# Patient Record
Sex: Female | Born: 1995 | Race: Black or African American | Hispanic: No | Marital: Single | Smoking: Never smoker
Health system: Southern US, Community
[De-identification: ages and names within clinical notes are randomized; demographics above are authoritative.]

## PROBLEM LIST (undated history)

## (undated) DIAGNOSIS — Z789 Other specified health status: Secondary | ICD-10-CM

## (undated) HISTORY — PX: NO PAST SURGERIES: SHX2092

---

## 2016-09-22 LAB — OB RESULTS CONSOLE HEPATITIS B SURFACE ANTIGEN: Hepatitis B Surface Ag: NEGATIVE

## 2016-09-22 LAB — OB RESULTS CONSOLE HIV ANTIBODY (ROUTINE TESTING): HIV: NONREACTIVE

## 2016-09-22 LAB — OB RESULTS CONSOLE RPR: RPR: NONREACTIVE

## 2017-02-05 LAB — OB RESULTS CONSOLE GC/CHLAMYDIA
CHLAMYDIA, DNA PROBE: NEGATIVE
Gonorrhea: NEGATIVE

## 2017-02-05 LAB — OB RESULTS CONSOLE GBS: STREP GROUP B AG: POSITIVE

## 2017-02-09 ENCOUNTER — Inpatient Hospital Stay
Admission: EM | Admit: 2017-02-09 | Discharge: 2017-02-12 | DRG: 806 | Disposition: A | Discharge status: Home / Self Care | Payer: Medicaid Other | Attending: Obstetrics and Gynecology | Admitting: Obstetrics and Gynecology

## 2017-02-09 ENCOUNTER — Other Ambulatory Visit: Payer: Self-pay

## 2017-02-09 DIAGNOSIS — O9081 Anemia of the puerperium: Secondary | ICD-10-CM | POA: Diagnosis not present

## 2017-02-09 DIAGNOSIS — O26893 Other specified pregnancy related conditions, third trimester: Secondary | ICD-10-CM | POA: Diagnosis present

## 2017-02-09 DIAGNOSIS — Z3A37 37 weeks gestation of pregnancy: Secondary | ICD-10-CM | POA: Diagnosis not present

## 2017-02-09 DIAGNOSIS — R109 Unspecified abdominal pain: Secondary | ICD-10-CM

## 2017-02-09 DIAGNOSIS — O99824 Streptococcus B carrier state complicating childbirth: Principal | ICD-10-CM | POA: Diagnosis present

## 2017-02-09 DIAGNOSIS — Z3483 Encounter for supervision of other normal pregnancy, third trimester: Secondary | ICD-10-CM | POA: Diagnosis present

## 2017-02-09 DIAGNOSIS — D62 Acute posthemorrhagic anemia: Secondary | ICD-10-CM | POA: Diagnosis not present

## 2017-02-09 HISTORY — DX: Other specified health status: Z78.9

## 2017-02-09 MED ORDER — FENTANYL CITRATE (PF) 100 MCG/2ML IJ SOLN
50.0000 ug | INTRAMUSCULAR | Status: DC | PRN
  Administered 2017-02-10 (×2): 50 ug via INTRAVENOUS
  Filled 2017-02-09: qty 2

## 2017-02-09 MED ORDER — PENICILLIN G POTASSIUM 5000000 UNITS IJ SOLR
2.5000 10*6.[IU] | INTRAVENOUS | Status: DC
  Filled 2017-02-09 (×3): qty 2.5

## 2017-02-09 MED ORDER — OXYTOCIN BOLUS FROM INFUSION: 500.0000 mL | Freq: Once | INTRAVENOUS | Status: DC

## 2017-02-09 MED ORDER — ONDANSETRON HCL 4 MG/2ML IJ SOLN
4.0000 mg | Freq: Four times a day (QID) | INTRAMUSCULAR | Status: DC | PRN
  Administered 2017-02-10: 4 mg via INTRAVENOUS
  Filled 2017-02-09: qty 2

## 2017-02-09 MED ORDER — LACTATED RINGERS IV SOLN
500.0000 mL | INTRAVENOUS | Status: DC | PRN
  Administered 2017-02-10: 500 mL via INTRAVENOUS

## 2017-02-09 MED ORDER — LIDOCAINE HCL (PF) 1 % IJ SOLN: 30.0000 mL | INTRAMUSCULAR | Status: DC | PRN

## 2017-02-09 MED ORDER — OXYTOCIN 40 UNITS IN LACTATED RINGERS INFUSION - SIMPLE MED
2.5000 [IU]/h | INTRAVENOUS | Status: DC
  Filled 2017-02-09: qty 1000

## 2017-02-09 MED ORDER — PENICILLIN G POTASSIUM 5000000 UNITS IJ SOLR
5.0000 10*6.[IU] | Freq: Once | INTRAVENOUS | Status: AC
  Administered 2017-02-10: 5 10*6.[IU] via INTRAVENOUS
  Filled 2017-02-09: qty 5

## 2017-02-09 MED ORDER — SOD CITRATE-CITRIC ACID 500-334 MG/5ML PO SOLN: 30.0000 mL | ORAL | Status: DC | PRN

## 2017-02-09 MED ORDER — LACTATED RINGERS IV SOLN
INTRAVENOUS | Status: DC
  Administered 2017-02-10 (×3): via INTRAVENOUS

## 2017-02-09 MED ORDER — ACETAMINOPHEN 325 MG PO TABS: 650.0000 mg | ORAL_TABLET | ORAL | Status: DC | PRN

## 2017-02-09 NOTE — H&P (Signed)
OB History & Physical   History of Present Illness:  Chief Complaint:   Worsening contractions with cervical change from 2cm to 4cm.  HPI:  Maureen Lopez is a 22 y.o. G3 p24 female at [redacted]w[redacted]d dated by US done at [redacted]w[redacted]d; Estimated Date of Delivery: 03/01/17.  LMP 04/26/16 unsure.    Active FM onset of ctx @ 1800 currently every 2-3 minutes; contractions worsening since arrival.  Denies LOF/VB. bloody show : none    Pregnancy Issues: 1. Late prenatal care at 19wks 2. Anemia, referred for iron infusions; taking iron TID 3. Resolved fetal unilateral urinary tract dilation.  4. Yeast UTI 12/2016- treated at [facility] L&D      Maternal Medical History:   Past Medical History:  Diagnosis Date  . Medical history non-contributory   anemia- referred for IV iron during pregnancy Hx uncomplicated SVD x 2, last 11/2016  Past Surgical History:  Procedure Laterality Date  . NO PAST SURGERIES      No Known Allergies  Prior to Admission medications   Medication Sig Start Date End Date Taking? Authorizing Provider  ferrous sulfate 325 (65 FE) MG EC tablet Take 325 mg by mouth 3 (three) times daily with meals.   Yes [provider]  Prenatal Vit-Fe Fumarate-FA (PRENATAL MULTIVITAMIN) TABS tablet Take 1 tablet by mouth daily at 12 noon.   Yes [provider]     Prenatal care site: [facility]   Social History: She  reports that  has never smoked. she has never used smokeless tobacco. She reports that she does not drink alcohol or use drugs.  Family History: family history is not on file.   Review of Systems: A full review of systems was performed and negative except as noted in the HPI.     Physical Exam:  Vital Signs: BP 125/61 (BP Location: Left Arm)   Pulse (!) 101   Temp 98.4 F (36.9 C) (Oral)   Resp 18   Ht 5\' 3"  (1.6 m)   Wt 153 lb (69.4 kg)   BMI 27.10 kg/m  General: no acute distress.  HEENT: normocephalic, atraumatic Heart:  regular rate & rhythm.  No murmurs/rubs/gallops Lungs: clear to auscultation bilaterally, normal respiratory effort Abdomen: soft, gravid, non-tender;  EFW: 7lbs Pelvic:   External: Normal external female genitalia  Cervix: Dilation: 4 / Effacement (%): 50 / Station: -2    Extremities: non-tender, symmetric, no edema bilaterally.  DTRs: 2+ Neurologic: Alert & oriented x 3.    No results found for this or any previous visit (from the past 24 hour(s)).  Pertinent Results:  Prenatal Labs: Blood type/Rh O Pos  Antibody screen neg  Rubella  Immune  Varicella  Immune  RPR NR  HBsAg Neg  HIV NR  GC neg  Chlamydia neg  Genetic screening negative  1 hour GTT  91  3 hour GTT   GBS  POS   FHT: 135bpm, mod, + accels, no decels TOCO: q2-58min SVE:  Dilation: 4 / Effacement (%): 50 / Station: -2    Cephalic by leopolds  No results found.  Assessment:  Maureen Lopez is a 22 y.o. G1P0 female at [redacted]w[redacted]d with GBS Pos and labor.   Plan:  1. Admit to Labor & Delivery; consents reviewed and obtained  2. Fetal Well being  - Fetal Tracing: Cat I tracing - Group B Streptococcus ppx indicated: Pos - Presentation: Cephalic confirmed by exam   3. Routine OB: - Prenatal labs reviewed,  as above - Rh O Pos - CBC, T&S, RPR on admit - Clear fluids, IVF  4. Monitoring of Labor -  Contractions external toco in place -  Pelvis proven to 7#3 -  Plan for continuous fetal monitoring -  Consider augmentation with Pitocin or AROM if labor progress slows. -  Maternal pain control as desired; will want epidural for pain mgmt - Anticipate vaginal delivery  5. Post Partum Planning: - Infant feeding: plans to pump breast and feed via bottle.  - Contraception: to be determined  Anthoni Geerts A, CNM 02/09/17 11:55 PM

## 2017-02-09 NOTE — OB Triage Note (Signed)
Patient c/o of contractions that started around 1800. Patient unsure of how far apart contractions are. Denies vaginal bleeding and LOF at this time. Reports positive FM. States this is a healthy pregnancy with no complications. Monitors placed and now assessing

## 2017-02-09 NOTE — Progress Notes (Signed)
Chief Complaint: Pt presented with C/o painful UCs x 3.5hrs. Denies VB or LOF. Endorses good FM. Reports uncomplicated pregnancy  Location: lower abd pain Onset/timing:  Duration: Quality:  Severity: Aggravating or alleviating conditions: Associated signs/symptoms: Context:   Maureen Lopez is a 22 y.o. female. She is at 2731w1d gestation. No LMP recorded. Patient is pregnant. Estimated Date of Delivery: 03/01/17  by US at 3442w1d; LMP 04/26/17 (unsure date). G3 P2002   Prenatal care site:  [facility]   Current pregnancy complicated by:  1. Late prenatal care at 19wks 2. Anemia, referred for iron infusions 3. Resolved fetal unilateral urinary tract dilation.  4. Yeast UTI 12/2016- [facility] L[facility]   S: Resting comfortably. no CTX, no VB.no LOF,  Active fetal movement. Denies: HA, visual changes, SOB, or RUQ/epigastric pain  Maternal Medical History:   Past Medical History:  Diagnosis Date  . Medical history non-contributory   Ob hx: term SVD x 2  Past Surgical History:  Procedure Laterality Date  . NO PAST SURGERIES      No Known Allergies  Prior to Admission medications   Medication Sig Start Date End Date Taking? Authorizing Provider  ferrous sulfate 325 (65 FE) MG EC tablet Take 325 mg by mouth 3 (three) times daily with meals.   Yes [provider]  Prenatal Vit-Fe Fumarate-FA (PRENATAL MULTIVITAMIN) TABS tablet Take 1 tablet by mouth daily at 12 noon.   Yes [provider]      Social History: She  reports that  has never smoked. she has never used smokeless tobacco. She reports that she does not drink alcohol or use drugs.  Family History: family history is not on file.   Review of Systems: A full review of systems was performed and negative except as noted in the HPI.     O:  There were no vitals taken for this visit. No results found for this or any previous visit (from the past 48 hour(s)).   Constitutional: NAD, AAOx3  HE/ENT:  extraocular movements grossly intact, moist mucous membranes CV: RRR PULM: nl respiratory effort, CTABL     Abd: gravid, non-tender, non-distended, soft      Ext: Non-tender, Nonedematous   Psych: mood appropriate, speech normal Pelvic: SVE per RN 2-3/50/-2  Fetal  monitoring: Cat 1 Appropriate for GA Baseline: 135 Variability: moderate Accelerations: present x >2 Decelerations absent     A/P: 22 y.o. 3531w1d here for antenatal surveillance for rule out labor   Fetal Wellbeing: Reassuring Cat 1 tracing.  Reactive NST   Plan to recheck SVE in 2hrs,    McVey, REBECCA A, CNM 02/09/17

## 2017-02-10 ENCOUNTER — Inpatient Hospital Stay: Payer: Medicaid Other | Admitting: Certified Registered Nurse Anesthetist

## 2017-02-10 LAB — TYPE AND SCREEN
ABO/RH(D): O POS
ANTIBODY SCREEN: NEGATIVE

## 2017-02-10 LAB — CBC
HCT: 31.9 % — ABNORMAL LOW (ref 35.0–47.0)
HEMOGLOBIN: 10.1 g/dL — AB (ref 12.0–16.0)
MCH: 22.4 pg — ABNORMAL LOW (ref 26.0–34.0)
MCHC: 31.7 g/dL — AB (ref 32.0–36.0)
MCV: 70.8 fL — ABNORMAL LOW (ref 80.0–100.0)
PLATELETS: 210 10*3/uL (ref 150–440)
RBC: 4.51 MIL/uL (ref 3.80–5.20)
RDW: 16.3 % — ABNORMAL HIGH (ref 11.5–14.5)
WBC: 13.5 10*3/uL — ABNORMAL HIGH (ref 3.6–11.0)

## 2017-02-10 MED ORDER — TETANUS-DIPHTH-ACELL PERTUSSIS 5-2.5-18.5 LF-MCG/0.5 IM SUSP: 0.5000 mL | Freq: Once | INTRAMUSCULAR | Status: DC

## 2017-02-10 MED ORDER — BENZOCAINE-MENTHOL 20-0.5 % EX AERO: 1.0000 "application " | INHALATION_SPRAY | CUTANEOUS | Status: DC | PRN

## 2017-02-10 MED ORDER — ACETAMINOPHEN 325 MG PO TABS
650.0000 mg | ORAL_TABLET | ORAL | Status: DC | PRN
  Administered 2017-02-10 – 2017-02-11 (×2): 650 mg via ORAL
  Filled 2017-02-10 (×2): qty 2

## 2017-02-10 MED ORDER — PRENATAL MULTIVITAMIN CH
1.0000 | ORAL_TABLET | Freq: Every day | ORAL | Status: DC
  Administered 2017-02-11 – 2017-02-12 (×2): 1 via ORAL
  Filled 2017-02-10 (×2): qty 1

## 2017-02-10 MED ORDER — BISACODYL 10 MG RE SUPP
10.0000 mg | Freq: Every day | RECTAL | Status: DC | PRN
  Filled 2017-02-10: qty 1

## 2017-02-10 MED ORDER — MISOPROSTOL 200 MCG PO TABS
ORAL_TABLET | ORAL | Status: AC
  Filled 2017-02-10: qty 4

## 2017-02-10 MED ORDER — IBUPROFEN 600 MG PO TABS
600.0000 mg | ORAL_TABLET | Freq: Four times a day (QID) | ORAL | Status: DC
  Administered 2017-02-10: 600 mg via ORAL
  Filled 2017-02-10 (×2): qty 1

## 2017-02-10 MED ORDER — WITCH HAZEL-GLYCERIN EX PADS: 1.0000 "application " | MEDICATED_PAD | CUTANEOUS | Status: DC | PRN

## 2017-02-10 MED ORDER — LIDOCAINE HCL (PF) 1 % IJ SOLN
INTRAMUSCULAR | Status: AC
  Filled 2017-02-10: qty 30

## 2017-02-10 MED ORDER — FENTANYL 2.5 MCG/ML W/ROPIVACAINE 0.15% IN NS 100 ML EPIDURAL (ARMC)
EPIDURAL | Status: AC
  Filled 2017-02-10: qty 100

## 2017-02-10 MED ORDER — FENTANYL 2.5 MCG/ML W/ROPIVACAINE 0.15% IN NS 100 ML EPIDURAL (ARMC)
EPIDURAL | Status: DC | PRN
  Administered 2017-02-10: 12 mL/h via EPIDURAL

## 2017-02-10 MED ORDER — DIPHENHYDRAMINE HCL 25 MG PO CAPS: 25.0000 mg | ORAL_CAPSULE | Freq: Four times a day (QID) | ORAL | Status: DC | PRN

## 2017-02-10 MED ORDER — SENNOSIDES-DOCUSATE SODIUM 8.6-50 MG PO TABS
2.0000 | ORAL_TABLET | ORAL | Status: DC
  Filled 2017-02-10: qty 2

## 2017-02-10 MED ORDER — ZOLPIDEM TARTRATE 5 MG PO TABS: 5.0000 mg | ORAL_TABLET | Freq: Every evening | ORAL | Status: DC | PRN

## 2017-02-10 MED ORDER — TERBUTALINE SULFATE 1 MG/ML IJ SOLN: 0.2500 mg | Freq: Once | INTRAMUSCULAR | Status: DC | PRN

## 2017-02-10 MED ORDER — SODIUM CHLORIDE 0.9% FLUSH: 3.0000 mL | INTRAVENOUS | Status: DC | PRN

## 2017-02-10 MED ORDER — MEASLES, MUMPS & RUBELLA VAC ~~LOC~~ INJ
0.5000 mL | INJECTION | Freq: Once | SUBCUTANEOUS | Status: DC
  Filled 2017-02-10: qty 0.5

## 2017-02-10 MED ORDER — IBUPROFEN 600 MG PO TABS
600.0000 mg | ORAL_TABLET | Freq: Four times a day (QID) | ORAL | Status: DC
  Administered 2017-02-10 – 2017-02-12 (×6): 600 mg via ORAL
  Filled 2017-02-10 (×7): qty 1

## 2017-02-10 MED ORDER — LIDOCAINE-EPINEPHRINE (PF) 1.5 %-1:200000 IJ SOLN
INTRAMUSCULAR | Status: DC | PRN
  Administered 2017-02-10: 3 mL via PERINEURAL

## 2017-02-10 MED ORDER — SODIUM CHLORIDE 0.9 % IV SOLN: 250.0000 mL | INTRAVENOUS | Status: DC | PRN

## 2017-02-10 MED ORDER — SIMETHICONE 80 MG PO CHEW: 80.0000 mg | CHEWABLE_TABLET | ORAL | Status: DC | PRN

## 2017-02-10 MED ORDER — SODIUM CHLORIDE 0.9% FLUSH: 3.0000 mL | Freq: Two times a day (BID) | INTRAVENOUS | Status: DC

## 2017-02-10 MED ORDER — FLEET ENEMA 7-19 GM/118ML RE ENEM: 1.0000 | ENEMA | Freq: Every day | RECTAL | Status: DC | PRN

## 2017-02-10 MED ORDER — LIDOCAINE HCL (PF) 1 % IJ SOLN
INTRAMUSCULAR | Status: DC | PRN
  Administered 2017-02-10: 3 mL

## 2017-02-10 MED ORDER — PENICILLIN G POT IN DEXTROSE 60000 UNIT/ML IV SOLN
3.0000 10*6.[IU] | INTRAVENOUS | Status: DC
  Administered 2017-02-10: 3 10*6.[IU] via INTRAVENOUS
  Filled 2017-02-10 (×6): qty 50

## 2017-02-10 MED ORDER — OXYTOCIN 40 UNITS IN LACTATED RINGERS INFUSION - SIMPLE MED: 1.0000 m[IU]/min | INTRAVENOUS | Status: DC

## 2017-02-10 MED ORDER — ONDANSETRON HCL 4 MG/2ML IJ SOLN: 4.0000 mg | INTRAMUSCULAR | Status: DC | PRN

## 2017-02-10 MED ORDER — OXYTOCIN 40 UNITS IN LACTATED RINGERS INFUSION - SIMPLE MED
1.0000 m[IU]/min | INTRAVENOUS | Status: DC
  Administered 2017-02-10: 2 m[IU]/min via INTRAVENOUS

## 2017-02-10 MED ORDER — BUPIVACAINE HCL (PF) 0.25 % IJ SOLN
INTRAMUSCULAR | Status: DC | PRN
  Administered 2017-02-10: 4 mL via EPIDURAL
  Administered 2017-02-10: 5 mL via EPIDURAL

## 2017-02-10 MED ORDER — OXYCODONE HCL 5 MG PO TABS: 5.0000 mg | ORAL_TABLET | ORAL | Status: DC | PRN

## 2017-02-10 MED ORDER — COCONUT OIL OIL: 1.0000 "application " | TOPICAL_OIL | Status: DC | PRN

## 2017-02-10 MED ORDER — ONDANSETRON HCL 4 MG PO TABS: 4.0000 mg | ORAL_TABLET | ORAL | Status: DC | PRN

## 2017-02-10 MED ORDER — DIBUCAINE 1 % RE OINT: 1.0000 "application " | TOPICAL_OINTMENT | RECTAL | Status: DC | PRN

## 2017-02-10 MED ORDER — AMMONIA AROMATIC IN INHA
RESPIRATORY_TRACT | Status: AC
  Filled 2017-02-10: qty 10

## 2017-02-10 MED ORDER — OXYTOCIN 10 UNIT/ML IJ SOLN
INTRAMUSCULAR | Status: AC
  Filled 2017-02-10: qty 2

## 2017-02-10 NOTE — Discharge Summary (Signed)
Obstetrical Discharge Summary  Patient Name: Maureen Lopez DOB: 1995/03/12 MRN: [id number]  Date of Admission: 02/09/2017 Date of Delivery: 02/10/17 Delivered by: C.Jones, CNM Date of Discharge: 02/12/17 Primary ON:[facility]EXBOB:[facility]  LMP:EDC Estimated Date of Delivery: 03/01/17 Gestational Age at Delivery: 356w2d   Antepartum complications: MW:[id number]:indicated abnormal fetal mild lt urinary tract dilation noted on 19 week scan, increased risk for Down's and increased risk 1:48 in Quad screen, Late PNC, Tobacco usage and quit with pregnancy. Anemia with PICA and Fe replacement.  Admitting Diagnosis: Term pregnancy in early labor  Secondary Diagnosis: Patient Active Problem List   Diagnosis Date Noted  . Abdominal pain in pregnancy, third trimester 02/09/2017  . Indication for care in labor or delivery 02/09/2017    Augmentation: AROM, Pitocin  Complications: Fetal decels to 60's requiring re-positioning, IFSE applied  Intrapartum complications/course: None Date of Delivery: 02/10/17 Delivered By: Milon Scorearon Jones, CNM Delivery Type: NSVD of viable female with small linear tear Rt periurethral non-repaired Anesthesia: epidural Placenta: sponatneous Laceration: Small linear tear Rt periurethral non-repaired Episiotomy: none Newborn Data:  Live born female  Birth Weight:  3710/6#0oz APGAR: , 688,9  Newborn Delivery   Birth date/time:  02/10/2017 10:41:00 Delivery type:  Vaginal, Spontaneous       Postpartum Procedures: None   Post partum course: Stable  Patient had an uncomplicated postpartum course.  By time of discharge on PPD#2, her pain was controlled on oral pain medications; she had appropriate lochia and was ambulating, voiding without difficulty and tolerating regular diet.  She was deemed stable for discharge to home.     Discharge Physical Exam:  BP 122/62 (BP Location: Right Arm)   Pulse 69   Temp 98.6 F (37 C) (Oral)   Resp 18   Ht 5\' 3"  (1.6 m)   Wt 69.4 kg (153 lb)   SpO2 99%    Breastfeeding? Unknown   BMI 27.10 kg/m   General: NAD CV: RRR Pulm: CTABL, nl effort ABD: s/nd/nt, fundus firm and below the umbilicus Lochia: moderate DVT Evaluation: LE non-ttp, no evidence of DVT on exam.  Hemoglobin  Date Value Ref Range Status  02/11/2017 9.3 (L) 12.0 - 16.0 g/dL Final   HCT  Date Value Ref Range Status  02/11/2017 29.4 (L) 35.0 - 47.0 % Final     Disposition: stable, discharge to home. Baby Feeding: breastmilk & formula Baby Disposition: home with mom  Rh Immune globulin given: N/A Rubella vaccine given: Pt is immune  Tdap vaccine given in AP or PP setting: 03/15/07 Flu vaccine given in AP or PP setting:   Contraception Plans: Mirena IUD  Prenatal Labs:  Recent Results (from the past 2160 hour(s))  OB RESULT CONSOLE Group B Strep     Status: None   Collection Time: 02/05/17 12:00 AM  Result Value Ref Range   GBS Positive   OB RESULTS CONSOLE GC/Chlamydia     Status: None   Collection Time: 02/05/17 12:00 AM  Result Value Ref Range   Gonorrhea Negative    Chlamydia Negative   CBC     Status: Abnormal   Collection Time: 02/09/17 11:43 PM  Result Value Ref Range   WBC 13.5 (H) 3.6 - 11.0 K/uL   RBC 4.51 3.80 - 5.20 MIL/uL   Hemoglobin 10.1 (L) 12.0 - 16.0 g/dL   HCT 27.231.9 (L) 53.635.0 - 64.447.0 %   MCV 70.8 (L) 80.0 - 100.0 fL   MCH 22.4 (L) 26.0 - 34.0 pg   MCHC 31.7 (L) 32.0 -  36.0 g/dL   RDW 40.9 (H) 81.1 - 91.4 %   Platelets 210 150 - 440 K/uL    Comment: Performed at [facility], [address] [place], [place] [address]  Type and screen [facility]     Status: None   Collection Time: 02/09/17 11:43 PM  Result Value Ref Range   ABO/RH(D) O POS    Antibody Screen NEG    Sample Expiration      02/12/2017 Performed at [facility], [address] [place], [place] [address]   RPR     Status: None   Collection Time: 02/09/17 11:43 PM  Result Value Ref Range   RPR Ser Ql Non Reactive Non  Reactive    Comment: (NOTE) Performed At: [facility] [address] [place], [place] [id number] Jolene Schimke MD [phone] Performed at [facility], [address] [place], [place] [address]   CBC     Status: Abnormal   Collection Time: 02/11/17  5:59 AM  Result Value Ref Range   WBC 11.1 (H) 3.6 - 11.0 K/uL   RBC 4.10 3.80 - 5.20 MIL/uL   Hemoglobin 9.3 (L) 12.0 - 16.0 g/dL   HCT 53.6 (L) 64.4 - 03.4 %   MCV 71.7 (L) 80.0 - 100.0 fL   MCH 22.7 (L) 26.0 - 34.0 pg   MCHC 31.7 (L) 32.0 - 36.0 g/dL   RDW 74.2 (H) 59.5 - 63.8 %   Platelets 168 150 - 440 K/uL    Comment: Performed at [facility], [address] [place], [place] [address]  RPR: NR HBSAG:neg HIV:Neg Drug screen:Negative  Plan:  FRANNIE SHEDRICK was discharged to home in good condition. Follow-up appointment with delivering provider in 4-6 weeks.  Discharge Medications: Allergies as of 02/12/2017   No Known Allergies     Medication List    STOP taking these medications   ferrous sulfate 325 (65 FE) MG EC tablet     TAKE these medications   ibuprofen 600 MG tablet Commonly known as:  ADVIL,MOTRIN Take 1 tablet (600 mg total) by mouth every 6 (six) hours.   prenatal multivitamin Tabs tablet Take 1 tablet by mouth daily at 12 noon.       Follow-up Information    Sharee Pimple, CNM. Schedule an appointment as soon as possible for a visit in 6 week(s).   Specialty:  Obstetrics and Gynecology Why:  Please make an appointment in 4-6 weeks for a routine postpartum visit.  Contact information: [address] [facility] [place] [address] [phone]        Genia Del, CNM Follow up in 6 week(s).   Specialty:  Certified Nurse Midwife Why:  call at 4 weeks to request Implanon insertion at 6 week PP exam  Contact information: [address] [place] [address] [phone]           Signed: Jennell Corner MD Hit refresh and delete this line

## 2017-02-10 NOTE — Discharge Instructions (Signed)
Vaginal Delivery, Care After °Refer to this sheet in the next few weeks. These instructions provide you with information about caring for yourself after vaginal delivery. Your health care provider may also give you more specific instructions. Your treatment has been planned according to current medical practices, but problems sometimes occur. Call your health care provider if you have any problems or questions. °What can I expect after the procedure? °After vaginal delivery, it is common to have: °· Some bleeding from your vagina. °· Soreness in your abdomen, your vagina, and the area of skin between your vaginal opening and your anus (perineum). °· Pelvic cramps. °· Fatigue. ° °Follow these instructions at home: °Medicines °· Take over-the-counter and prescription medicines only as told by your health care provider. °· If you were prescribed an antibiotic medicine, take it as told by your health care provider. Do not stop taking the antibiotic until it is finished. °Driving ° °· Do not drive or operate heavy machinery while taking prescription pain medicine. °· Do not drive for 24 hours if you received a sedative. °Lifestyle °· Do not drink alcohol. This is especially important if you are breastfeeding or taking medicine to relieve pain. °· Do not use tobacco products, including cigarettes, chewing tobacco, or e-cigarettes. If you need help quitting, ask your health care provider. °Eating and drinking °· Drink at least 8 eight-ounce glasses of water every day unless you are told not to by your health care provider. If you choose to breastfeed your baby, you may need to drink more water than this. °· Eat high-fiber foods every day. These foods may help prevent or relieve constipation. High-fiber foods include: °? Whole grain cereals and breads. °? Brown rice. °? Beans. °? Fresh fruits and vegetables. °Activity °· Return to your normal activities as told by your health care provider. Ask your health care provider  what activities are safe for you. °· Rest as much as possible. Try to rest or take a nap when your baby is sleeping. °· Do not lift anything that is heavier than your baby or 10 lb (4.5 kg) until your health care provider says that it is safe. °· Talk with your health care provider about when you can engage in sexual activity. This may depend on your: °? Risk of infection. °? Rate of healing. °? Comfort and desire to engage in sexual activity. °Vaginal Care °· If you have an episiotomy or a vaginal tear, check the area every day for signs of infection. Check for: °? More redness, swelling, or pain. °? More fluid or blood. °? Warmth. °? Pus or a bad smell. °· Do not use tampons or douches until your health care provider says this is safe. °· Watch for any blood clots that may pass from your vagina. These may look like clumps of dark red, brown, or black discharge. °General instructions °· Keep your perineum clean and dry as told by your health care provider. °· Wear loose, comfortable clothing. °· Wipe from front to back when you use the toilet. °· Ask your health care provider if you can shower or take a bath. If you had an episiotomy or a perineal tear during labor and delivery, your health care provider may tell you not to take baths for a certain length of time. °· Wear a bra that supports your breasts and fits you well. °· If possible, have someone help you with household activities and help care for your baby for at least a few days after   you leave the hospital. °· Keep all follow-up visits for you and your baby as told by your health care provider. This is important. °Contact a health care provider if: °· You have: °? Vaginal discharge that has a bad smell. °? Difficulty urinating. °? Pain when urinating. °? A sudden increase or decrease in the frequency of your bowel movements. °? More redness, swelling, or pain around your episiotomy or vaginal tear. °? More fluid or blood coming from your episiotomy or  vaginal tear. °? Pus or a bad smell coming from your episiotomy or vaginal tear. °? A fever. °? A rash. °? Little or no interest in activities you used to enjoy. °? Questions about caring for yourself or your baby. °· Your episiotomy or vaginal tear feels warm to the touch. °· Your episiotomy or vaginal tear is separating or does not appear to be healing. °· Your breasts are painful, hard, or turn red. °· You feel unusually sad or worried. °· You feel nauseous or you vomit. °· You pass large blood clots from your vagina. If you pass a blood clot from your vagina, save it to show to your health care provider. Do not flush blood clots down the toilet without having your health care provider look at them. °· You urinate more than usual. °· You are dizzy or light-headed. °· You have not breastfed at all and you have not had a menstrual period for 12 weeks after delivery. °· You have stopped breastfeeding and you have not had a menstrual period for 12 weeks after you stopped breastfeeding. °Get help right away if: °· You have: °? Pain that does not go away or does not get better with medicine. °? Chest pain. °? Difficulty breathing. °? Blurred vision or spots in your vision. °? Thoughts about hurting yourself or your baby. °· You develop pain in your abdomen or in one of your legs. °· You develop a severe headache. °· You faint. °· You bleed from your vagina so much that you fill two sanitary pads in one hour. °This information is not intended to replace advice given to you by your health care provider. Make sure you discuss any questions you have with your health care provider. °Document Released: 01/16/2000 Document Revised: 07/02/2015 Document Reviewed: 02/02/2015 °Elsevier Interactive Patient Education © 2018 [employer] ° °

## 2017-02-10 NOTE — Progress Notes (Signed)
Patient ID: Maureen Lopez, female   DOB: 08-Jun-1995, 22 y.o.   MRN: [id number] VAGINAL DELIVERY NOTE:  Date of Delivery: 02/10/2017 Primary OB: [facility] Gestational Age/EDD: 2562w2d 03/01/2017, by Patient Reported Antepartum complications: none  Attending Physician: CJones, CNM Delivery Type:  NSVD of viable female infant  Anesthesia: Epidural  Laceration:none Episiotomy: None Placenta:SDOP intact Intrapartum complications: None  Estimated Blood Loss: 100 ml's GBS: Pos Procedure Details: CTSP as pt was complete and pushing uncontrollably, Placed in stirrups with HOB elevated. NSVD of vtx, Ant and post shoulder and body del at 1041 and infant to mom's abd, no CAN noted. Delayed cord clamping with CCx2 and cut per Dad. Cord blood collected. SDOP intact with no evidence of abruption or other problems. Perineum intact except for 1 linear tear very superficial of the Lt perineal area. VSS. EBL 395 ml's. Hemostasis achieved, Skin to skin with mom, No needles or sponges used. Pitocin per IV drip pp.   Baby: Liveborn Boy "Kayden", Apgars 8,9, weight 6 #, 0 oz, baby named "Maureen Lopez"

## 2017-02-10 NOTE — Progress Notes (Signed)
Maureen Lopez is a 22 y.o. G3P2 at 6477w2d. See H&P.   Subjective: "I am comfortable with UC's".  Objective: BP 120/68   Pulse 82   Temp 98.8 F (37.1 C) (Oral)   Resp 18   Ht 5\' 3"  (1.6 m)   Wt 69.4 kg (153 lb)   SpO2 100%   BMI 27.10 kg/m  I/O last 3 completed shifts: In: 867.4 [I.V.:867.4] Out: -  No intake/output data recorded.  FHT: 130, Cat 1 UC: q 3 mins,  SVE:   5/80%/vtx-1 Labs: Lab Results  Component Value Date   WBC 13.5 (H) 02/09/2017   HGB 10.1 (L) 02/09/2017   HCT 31.9 (L) 02/09/2017   MCV 70.8 (L) 02/09/2017   PLT 210 02/09/2017  AROM for clear fluid mod amt. IUPC inserted for improved monitoring and ability to run Pitocin more accurately. Pitocin on . Dr Bernestine AmassBeasely aware of pt status and agrees with plan of care.4 mun/min   Assessment / Plan:  Labor:Progressing Preeclampsia: None  Fetal Wellbeing: Reassuring  Pain Control:   Epidural  I/D:   None Anticipated MOD: SVD   Sharee Pimplearon W Jones 02/10/2017, 9:15 AM

## 2017-02-10 NOTE — Progress Notes (Signed)
Epidural catheter removed intact 

## 2017-02-10 NOTE — Anesthesia Preprocedure Evaluation (Signed)
Anesthesia Evaluation  Patient identified by MRN, date of birth, ID band Patient awake and Patient confused    Reviewed: Allergy & Precautions, NPO status , Patient's Chart, lab work & pertinent test results, Unable to perform ROS - Chart review only  History of Anesthesia Complications Negative for: history of anesthetic complications  Airway Mallampati: II  TM Distance: >3 FB     Dental  (+) Teeth Intact   Pulmonary neg pulmonary ROS,    Pulmonary exam normal breath sounds clear to auscultation       Cardiovascular Exercise Tolerance: Good negative cardio ROS Normal cardiovascular exam Rhythm:regular     Neuro/Psych negative neurological ROS  negative psych ROS   GI/Hepatic negative GI ROS, Neg liver ROS,   Endo/Other  negative endocrine ROS  Renal/GU negative Renal ROS  negative genitourinary   Musculoskeletal   Abdominal   Peds  Hematology negative hematology ROS (+)   Anesthesia Other Findings   Reproductive/Obstetrics (+) Pregnancy                             Anesthesia Physical Anesthesia Plan  ASA: II  Anesthesia Plan: Epidural   Post-op Pain Management:    Induction:   PONV Risk Score and Plan:   Airway Management Planned:   Additional Equipment:   Intra-op Plan:   Post-operative Plan:   Informed Consent: I have reviewed the patients History and Physical, chart, labs and discussed the procedure including the risks, benefits and alternatives for the proposed anesthesia with the patient or authorized representative who has indicated his/her understanding and acceptance.     Plan Discussed with: Anesthesiologist  Anesthesia Plan Comments:         Anesthesia Quick Evaluation

## 2017-02-10 NOTE — Progress Notes (Signed)
Labor Progress Note  Maureen Lopez is a 22 y.o. G3P2 at 2834w2d by ultrasound admitted for labor  Subjective: feeling UCs, sleeping between, s/p 1 dose of fentanyl.   Objective: BP 135/77 (BP Location: Left Arm)   Pulse 85   Temp 98.5 F (36.9 C) (Oral)   Resp 18   Ht 5\' 3"  (1.6 m)   Wt 153 lb (69.4 kg)   BMI 27.10 kg/m  Notable VS details: reviewed  Fetal Assessment: FHT:  FHR: 135 bpm, variability: moderate,  accelerations:  Abscent,  decelerations:  Present variables nonrecurrent Category/reactivity:  Category II UC:   irregular, every 1-4 minutes, Pitocin started about 0530 SVE:  Dilation: 4.5 Effacement (%): 70 Cervical Position: Middle Station: -1 Presentation: Vertex Exam by:: RS Membrane status: intact   Labs: Lab Results  Component Value Date   WBC 13.5 (H) 02/09/2017   HGB 10.1 (L) 02/09/2017   HCT 31.9 (L) 02/09/2017   MCV 70.8 (L) 02/09/2017   PLT 210 02/09/2017    Assessment / Plan: IUP at 37.2wks, + GBS, progressed spontaneously to 4-5cm. brief periods of Cat II tracing with intermittent variables.   Labor: Progressing on Pitocin, will continue to increase then AROM Preeclampsia:  no signs or symptoms of toxicity Fetal Wellbeing:  Category I now Pain Control:  IVPM or epidural on request, s/p IVPM x 1. I/D:  GBS Pos- s/p 2 doses of PCN Anticipated MOD:  NSVD  Onika Gudiel A, CNM 02/10/2017, 6:15 AM

## 2017-02-11 LAB — CBC
HCT: 29.4 % — ABNORMAL LOW (ref 35.0–47.0)
Hemoglobin: 9.3 g/dL — ABNORMAL LOW (ref 12.0–16.0)
MCH: 22.7 pg — ABNORMAL LOW (ref 26.0–34.0)
MCHC: 31.7 g/dL — ABNORMAL LOW (ref 32.0–36.0)
MCV: 71.7 fL — ABNORMAL LOW (ref 80.0–100.0)
PLATELETS: 168 10*3/uL (ref 150–440)
RBC: 4.1 MIL/uL (ref 3.80–5.20)
RDW: 16.2 % — AB (ref 11.5–14.5)
WBC: 11.1 10*3/uL — AB (ref 3.6–11.0)

## 2017-02-11 LAB — RPR: RPR: NONREACTIVE

## 2017-02-11 MED ORDER — FERROUS SULFATE 325 (65 FE) MG PO TABS
325.0000 mg | ORAL_TABLET | Freq: Every day | ORAL | Status: DC
  Administered 2017-02-11 – 2017-02-12 (×2): 325 mg via ORAL
  Filled 2017-02-11 (×2): qty 1

## 2017-02-11 NOTE — Anesthesia Postprocedure Evaluation (Signed)
Anesthesia Post Note  Patient: Maureen Lopez  Procedure(s) Performed: AN AD HOC LABOR EPIDURAL  Patient location during evaluation: Mother Baby Anesthesia Type: Epidural Level of consciousness: awake, awake and alert and oriented Pain management: pain level controlled Vital Signs Assessment: post-procedure vital signs reviewed and stable Respiratory status: spontaneous breathing Cardiovascular status: blood pressure returned to baseline Postop Assessment: no headache, no backache and no apparent nausea or vomiting Anesthetic complications: no     Last Vitals:  Vitals:   02/10/17 2343 02/11/17 0408  BP: 127/70 (!) 103/55  Pulse: 91 68  Resp: 16 16  Temp: 36.7 C 36.7 C  SpO2: 98% 100%    Last Pain:  Vitals:   02/11/17 0408  TempSrc: Oral  PainSc:                  Karoline Caldwelleana Loralei Radcliffe

## 2017-02-11 NOTE — Progress Notes (Signed)
Post Partum Day 1  Subjective: Doing well, no concerns. Ambulating without difficulty, pain managed with PO meds, tolerating regular diet, and voiding without difficulty.   No fever/chills, chest pain, shortness of breath, nausea/vomiting, or leg pain. No nipple or breast pain.  Objective: BP 136/71 (BP Location: Left Arm)   Pulse 70   Temp 98.7 F (37.1 C) (Oral)   Resp 18   Ht 5\' 3"  (1.6 m)   Wt 69.4 kg (153 lb)   SpO2 99%   Breastfeeding? Unknown   BMI 27.10 kg/m    Physical Exam:  General: alert, cooperative, appears stated age and no distress Breasts: soft/nontender CV: RRR Pulm: nl effort, CTABL Abdomen: soft, non-tender, active bowel sounds Uterine Fundus: firm Lochia: appropriate DVT Evaluation: No evidence of DVT seen on physical exam. No cords or calf tenderness. No significant calf/ankle edema.  Recent Labs    02/09/17 2343 02/11/17 0559  HGB 10.1* 9.3*  HCT 31.9* 29.4*  WBC 13.5* 11.1*  PLT 210 168    Assessment/Plan: 21 y.o. G3P3003 postpartum day # 1  -Continue routine PP care -Lactation consult, discussed normal colostrum and milk production.  -Acute blood loss anemia - hemodynamically stable and asymptomatic; start po ferrous sulfate daily with stool softeners.  Disposition: Might desire d/c home today. Continue inpatient postpartum care.    LOS: 2 days   Genia DelMargaret Trelyn Vanderlinde, CNM 02/11/2017, 10:34 AM   ----- Genia DelMargaret Constanza Mincy Certified Nurse Midwife [facility]

## 2017-02-12 MED ORDER — IBUPROFEN 600 MG PO TABS: 600.0000 mg | ORAL_TABLET | Freq: Four times a day (QID) | ORAL | 0 refills | Status: DC

## 2017-02-12 NOTE — Progress Notes (Signed)
Patient discharged home with infant. Discharge instructions, prescriptions and follow up appointment given to and reviewed with patient. Patient verbalized understanding. Patient wheeled out with infant by RN 

## 2017-06-16 ENCOUNTER — Other Ambulatory Visit: Payer: Self-pay

## 2017-06-16 ENCOUNTER — Emergency Department
Admission: EM | Admit: 2017-06-16 | Discharge: 2017-06-16 | Disposition: A | Discharge status: Home / Self Care | Payer: Medicaid Other | Attending: Emergency Medicine | Admitting: Emergency Medicine

## 2017-06-16 ENCOUNTER — Encounter: Payer: Self-pay | Admitting: Emergency Medicine

## 2017-06-16 DIAGNOSIS — J069 Acute upper respiratory infection, unspecified: Secondary | ICD-10-CM | POA: Insufficient documentation

## 2017-06-16 DIAGNOSIS — B9789 Other viral agents as the cause of diseases classified elsewhere: Secondary | ICD-10-CM

## 2017-06-16 DIAGNOSIS — R509 Fever, unspecified: Secondary | ICD-10-CM | POA: Diagnosis not present

## 2017-06-16 DIAGNOSIS — R05 Cough: Secondary | ICD-10-CM | POA: Diagnosis present

## 2017-06-16 MED ORDER — GUAIFENESIN-CODEINE 100-10 MG/5ML PO SOLN: 10.0000 mL | Freq: Three times a day (TID) | ORAL | 0 refills | Status: DC | PRN

## 2017-06-16 NOTE — ED Notes (Signed)
See triage note   Presents with cough and sinus congestion   Sx's started about 3 days ago  Low grade fever on arrival   States cough is non prod

## 2017-06-16 NOTE — ED Triage Notes (Signed)
C/O chills, sinus congestion, cough x 3 days.

## 2017-06-16 NOTE — ED Provider Notes (Signed)
[facility] Emergency Department Provider Note  ____________________________________________  Time seen: Approximately 2:28 PM  I have reviewed the triage vital signs and the nursing notes.   HISTORY  Chief Complaint Cough and Chills   HPI KAYSI OURADA is a 22 y.o. female who presents to the emergency department for evaluation and treatment of cough and sinus congestion with chills and low grade fever. Symptoms started about 3 days ago.  No alleviating measures have been attempted for this complaint.   Past Medical History:  Diagnosis Date  . Medical history non-contributory     Patient Active Problem List   Diagnosis Date Noted  . Abdominal pain in pregnancy, third trimester 02/09/2017  . Indication for care in labor or delivery 02/09/2017    Past Surgical History:  Procedure Laterality Date  . NO PAST SURGERIES      Prior to Admission medications   Medication Sig Start Date End Date Taking? Authorizing Provider  ibuprofen (ADVIL,MOTRIN) 600 MG tablet Take 1 tablet (600 mg total) by mouth every 6 (six) hours. 02/12/17   Schermerhorn, Ihor Austin, MD  Prenatal Vit-Fe Fumarate-FA (PRENATAL MULTIVITAMIN) TABS tablet Take 1 tablet by mouth daily at 12 noon.    [provider]    Allergies Patient has no known allergies.  No family history on file.  Social History Social History   Tobacco Use  . Smoking status: Never Smoker  . Smokeless tobacco: Never Used  Substance Use Topics  . Alcohol use: No    Frequency: Never  . Drug use: No    Review of Systems Constitutional: Positive for fever/chills ENT: Negative for sore throat. Cardiovascular: Denies chest pain. Respiratory: Negative for shortness of breath.  Positive for cough. Gastrointestinal: Negative for nausea, no vomiting.  No diarrhea.  Musculoskeletal: Positive for body aches Skin: Negative for rash. Neurological: Negative for  headaches ____________________________________________   PHYSICAL EXAM:  VITAL SIGNS: ED Triage Vitals  Enc Vitals Group     BP 06/16/17 1353 (!) 147/80     Pulse Rate 06/16/17 1353 (!) 105     Resp 06/16/17 1353 16     Temp 06/16/17 1353 99.9 F (37.7 C)     Temp Source 06/16/17 1353 Oral     SpO2 06/16/17 1353 96 %     Weight 06/16/17 1350 135 lb (61.2 kg)     Height 06/16/17 1350  (1.6 m)     Head Circumference --      Peak Flow --      Pain Score 06/16/17 1350 6     Pain Loc --      Pain Edu? --      Excl. in GC? --     Constitutional: Alert and oriented.  Acutely ill appearing and in no acute distress. Eyes: Conjunctivae are normal. EOMI. Ears: Bilateral tympanic membranes are retracted and mildly erythematous Nose: Sinus congestion noted; clear rhinnorhea. Mouth/Throat: Mucous membranes are moist.  Oropharynx normal. Tonsils not visualized. Neck: No stridor.  Lymphatic: No cervical lymphadenopathy. Cardiovascular: Normal rate, regular rhythm. Good peripheral circulation. Respiratory: Normal respiratory effort.  No retractions.  Breath sounds clear to auscultation throughout. Gastrointestinal: Soft and nontender.  Musculoskeletal: FROM x 4 extremities.  Neurologic:  Normal speech and language.  Skin:  Skin is warm, dry and intact. No rash noted. Psychiatric: Mood and affect are normal. Speech and behavior are normal.  ____________________________________________   LABS (all labs ordered are listed, but only abnormal results are displayed)  Labs Reviewed -  No data to display ____________________________________________  EKG  Not indicated ____________________________________________  RADIOLOGY  Not indicated ____________________________________________   PROCEDURES  Procedure(s) performed: None  Critical Care performed: No ____________________________________________   INITIAL IMPRESSION / ASSESSMENT AND PLAN / ED COURSE  22 y.o. female  who presents to the emergency department for treatment and evaluation of symptoms and exam most consistent with a viral upper respiratory infection.  She will be given a prescription for guaifenesin with codeine and encouraged to rest, increase fluids for the next few days.  She was encouraged to return to the ER for symptoms that change or worsen if unable to schedule an appointment.  Medications - No data to display  ED Discharge Orders    None       Pertinent labs & imaging results that were available during my care of the patient were reviewed by me and considered in my medical decision making (see chart for details).    If controlled substance prescribed during this visit, 12 month history viewed on the [place] prior to issuing an initial prescription for Schedule II or III opiod. ____________________________________________   FINAL CLINICAL IMPRESSION(S) / ED DIAGNOSES  Final diagnoses:  None    Note:  This document was prepared using Dragon voice recognition software and may include unintentional dictation errors.     Chinita Pester, FNP 06/16/17 1505    Jene Every, MD 06/16/17 [phone]

## 2017-07-18 ENCOUNTER — Emergency Department
Admission: EM | Admit: 2017-07-18 | Discharge: 2017-07-18 | Disposition: A | Discharge status: Home / Self Care | Payer: No Typology Code available for payment source | Attending: Emergency Medicine | Admitting: Emergency Medicine

## 2017-07-18 ENCOUNTER — Emergency Department: Payer: No Typology Code available for payment source

## 2017-07-18 ENCOUNTER — Encounter: Payer: Self-pay | Admitting: Intensive Care

## 2017-07-18 ENCOUNTER — Other Ambulatory Visit: Payer: Self-pay

## 2017-07-18 DIAGNOSIS — Y929 Unspecified place or not applicable: Secondary | ICD-10-CM | POA: Insufficient documentation

## 2017-07-18 DIAGNOSIS — Y999 Unspecified external cause status: Secondary | ICD-10-CM | POA: Insufficient documentation

## 2017-07-18 DIAGNOSIS — Y939 Activity, unspecified: Secondary | ICD-10-CM | POA: Insufficient documentation

## 2017-07-18 DIAGNOSIS — S0990XA Unspecified injury of head, initial encounter: Secondary | ICD-10-CM | POA: Diagnosis not present

## 2017-07-18 DIAGNOSIS — Z79899 Other long term (current) drug therapy: Secondary | ICD-10-CM | POA: Insufficient documentation

## 2017-07-18 DIAGNOSIS — M79662 Pain in left lower leg: Secondary | ICD-10-CM | POA: Diagnosis not present

## 2017-07-18 MED ORDER — CYCLOBENZAPRINE HCL 10 MG PO TABS: 10.0000 mg | ORAL_TABLET | Freq: Three times a day (TID) | ORAL | 0 refills | Status: AC | PRN

## 2017-07-18 MED ORDER — MELOXICAM 15 MG PO TABS: 15.0000 mg | ORAL_TABLET | Freq: Every day | ORAL | 1 refills | Status: AC
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
## 2017-08-06 ENCOUNTER — Emergency Department
Admission: EM | Admit: 2017-08-06 | Discharge: 2017-08-06 | Disposition: A | Discharge status: Home / Self Care | Payer: Self-pay | Attending: Emergency Medicine | Admitting: Emergency Medicine

## 2017-08-06 ENCOUNTER — Encounter: Payer: Self-pay | Admitting: Emergency Medicine

## 2017-08-06 DIAGNOSIS — L03314 Cellulitis of groin: Secondary | ICD-10-CM | POA: Insufficient documentation

## 2017-08-06 MED ORDER — SULFAMETHOXAZOLE-TRIMETHOPRIM 800-160 MG PO TABS: 1.0000 | ORAL_TABLET | Freq: Two times a day (BID) | ORAL | 0 refills | Status: DC

## 2017-08-06 MED ORDER — SULFAMETHOXAZOLE-TRIMETHOPRIM 800-160 MG PO TABS
1.0000 | ORAL_TABLET | Freq: Once | ORAL | Status: AC
  Administered 2017-08-06: 1 via ORAL
  Filled 2017-08-06: qty 1

## 2017-08-06 NOTE — ED Provider Notes (Signed)
[facility] Emergency Department Provider Note  ____________________________________________  Time seen: Approximately 9:58 PM  I have reviewed the triage vital signs and the nursing notes.   HISTORY  Chief Complaint Abscess    HPI Maureen Lopez is a 22 y.o. female who presents the emergency department complaining of a "abscess" to the left inguinal region.  Patient reports that she noticed a "pimple" in the left inguinal region.  Patient reports that she shaves and initially thought it was a hairball.  Today, it was enlarged and she "popped it."  Patient reports a minimal expression of purulent drainage.  It is not freely draining at this time.  Patient reports a small knot to the region.  She denies any systemic complaints of fevers or chills, nausea vomiting, abdominal pain.  No history of recurrent skin lesion.  No other complaints at this time.    Past Medical History:  Diagnosis Date  . Medical history non-contributory     Patient Active Problem List   Diagnosis Date Noted  . Abdominal pain in pregnancy, third trimester 02/09/2017  . Indication for care in labor or delivery 02/09/2017    Past Surgical History:  Procedure Laterality Date  . NO PAST SURGERIES      Prior to Admission medications   Medication Sig Start Date End Date Taking? Authorizing Provider  guaiFENesin-codeine 100-10 MG/5ML syrup Take 10 mLs by mouth 3 (three) times daily as needed. 06/16/17   Triplett, Rulon Eisenmenger B, FNP  ibuprofen (ADVIL,MOTRIN) 600 MG tablet Take 1 tablet (600 mg total) by mouth every 6 (six) hours. 02/12/17   Schermerhorn, Ihor Austin, MD  Prenatal Vit-Fe Fumarate-FA (PRENATAL MULTIVITAMIN) TABS tablet Take 1 tablet by mouth daily at 12 noon.    [provider]  sulfamethoxazole-trimethoprim (BACTRIM DS,SEPTRA DS) 800-160 MG tablet Take 1 tablet by mouth 2 (two) times daily. 08/06/17   Laquon Emel, Delorise Royals, PA-C    Allergies Patient has no known  allergies.  No family history on file.  Social History Social History   Tobacco Use  . Smoking status: Never Smoker  . Smokeless tobacco: Never Used  Substance Use Topics  . Alcohol use: Yes    Frequency: Never    Comment: occ  . Drug use: No     Review of Systems  Constitutional: No fever/chills Eyes: No visual changes. Cardiovascular: no chest pain. Respiratory: no cough. No SOB. Gastrointestinal: No abdominal pain.  No nausea, no vomiting.   Musculoskeletal: Negative for musculoskeletal pain. Skin: Positive for "abscess" the left inguinal region. Neurological: Negative for headaches, focal weakness or numbness. 10-point ROS otherwise negative.  ____________________________________________   PHYSICAL EXAM:  VITAL SIGNS: ED Triage Vitals  Enc Vitals Group     BP 08/06/17 2117 120/75     Pulse Rate 08/06/17 2117 (!) 115     Resp 08/06/17 2117 16     Temp 08/06/17 2117 99.3 F (37.4 C)     Temp Source 08/06/17 2117 Oral     SpO2 08/06/17 2117 98 %     Weight 08/06/17 2116 125 lb (56.7 kg)     Height 08/06/17 2116 5\' 3"  (1.6 m)     Head Circumference --      Peak Flow --      Pain Score 08/06/17 2116 8     Pain Loc --      Pain Edu? --      Excl. in GC? --      Constitutional: Alert and oriented. Well  appearing and in no acute distress. Eyes: Conjunctivae are normal. PERRL. EOMI. Head: Atraumatic. Neck: No stridor.   Hematological/Lymphatic/Immunilogical: No inguinal lymphadenopathy appreciated Cardiovascular: Normal rate, regular rhythm. Normal S1 and S2.  Good peripheral circulation. Respiratory: Normal respiratory effort without tachypnea or retractions. Lungs CTAB. Good air entry to the bases with no decreased or absent breath sounds. Gastrointestinal: Bowel sounds 4 quadrants. Soft and nontender to palpation. No guarding or rigidity. No palpable masses. No distention. No CVA tenderness. Musculoskeletal: Full range of motion to all extremities. No  gross deformities appreciated. Neurologic:  Normal speech and language. No gross focal neurologic deficits are appreciated.  Skin:  Skin is warm, dry and intact. No rash noted.  Small erythematous and edematous lesion noted to the left inguinal region.  Palpation reveals tenderness and firmness but no fluctuance or induration.  Palpation does not express any drainage. Psychiatric: Mood and affect are normal. Speech and behavior are normal. Patient exhibits appropriate insight and judgement.  Exam was chaperoned by female RN ____________________________________________   LABS (all labs ordered are listed, but only abnormal results are displayed)  Labs Reviewed - No data to display ____________________________________________  EKG   ____________________________________________  RADIOLOGY   No results found.  ____________________________________________    PROCEDURES  Procedure(s) performed:    Procedures    Medications  sulfamethoxazole-trimethoprim (BACTRIM DS,SEPTRA DS) 800-160 MG per tablet 1 tablet (has no administration in time range)     ____________________________________________   INITIAL IMPRESSION / ASSESSMENT AND PLAN / ED COURSE  Pertinent labs & imaging results that were available during my care of the patient were reviewed by me and considered in my medical decision making (see chart for details).  Review of the [place] CSRS was performed in accordance of the [facility] prior to dispensing any controlled drugs.      Patient's diagnosis is consistent with cellulitis to the left groin.  Patient presents the emergency department complaining of an abscess.  Patient did "pop" the lesion and expressed a small amount of purulent drainage.  On exam, no induration or fluctuance concerning for deep underlying abscess.  No indication for incision and drainage at this time.  Patient placed on oral antibiotics.  First dose is given here in the emergency department.  Tylenol  and/or Motrin as needed at home for pain.. Patient will be discharged home with prescriptions for Bactrim. Patient is to follow up with primary care as needed or otherwise directed. Patient is given ED precautions to return to the ED for any worsening or new symptoms.     ____________________________________________  FINAL CLINICAL IMPRESSION(S) / ED DIAGNOSES  Final diagnoses:  Cellulitis of groin      NEW MEDICATIONS STARTED DURING THIS VISIT:  ED Discharge Orders        Ordered    sulfamethoxazole-trimethoprim (BACTRIM DS,SEPTRA DS) 800-160 MG tablet  2 times daily     08/06/17 2201          This chart was dictated using voice recognition software/Dragon. Despite best efforts to proofread, errors can occur which can change the meaning. Any change was purely unintentional.    Racheal PatchesCuthriell, Delbert Vu D, PA-C 08/06/17 2201    Phineas SemenGoodman, Graydon, MD 08/06/17 [id number]

## 2017-08-06 NOTE — ED Notes (Signed)
ED Provider at bedside. 

## 2017-08-06 NOTE — ED Triage Notes (Signed)
Patient reports abscess to left groin times two days. Patient reports that it is getting larger. Patient states that it is draining some purulent drainage.

## 2017-08-06 NOTE — ED Notes (Signed)
Pt says she just woke up and noticed a "big hard boil" on her left lower abd;

## 2017-08-06 NOTE — ED Notes (Signed)
Pt reports ingrown hair to left groin that looked like a pimple last night. Tried to pop it. Woke up this morning and it was red and swollen. States that it has gotten worse today. Painful & tender to touch. Hard in middle of area. Redness surrounding area approx size of half dollar.

## 2018-02-23 ENCOUNTER — Other Ambulatory Visit: Payer: Self-pay

## 2018-02-23 ENCOUNTER — Emergency Department
Admission: EM | Admit: 2018-02-23 | Discharge: 2018-02-23 | Disposition: A | Discharge status: Home / Self Care | Payer: No Typology Code available for payment source | Attending: Emergency Medicine | Admitting: Emergency Medicine

## 2018-02-23 ENCOUNTER — Emergency Department: Payer: No Typology Code available for payment source

## 2018-02-23 DIAGNOSIS — Y990 Civilian activity done for income or pay: Secondary | ICD-10-CM | POA: Diagnosis not present

## 2018-02-23 DIAGNOSIS — S9001XA Contusion of right ankle, initial encounter: Secondary | ICD-10-CM | POA: Insufficient documentation

## 2018-02-23 DIAGNOSIS — W208XXA Other cause of strike by thrown, projected or falling object, initial encounter: Secondary | ICD-10-CM | POA: Diagnosis not present

## 2018-02-23 DIAGNOSIS — Y929 Unspecified place or not applicable: Secondary | ICD-10-CM | POA: Insufficient documentation

## 2018-02-23 DIAGNOSIS — Y939 Activity, unspecified: Secondary | ICD-10-CM | POA: Insufficient documentation

## 2018-02-23 DIAGNOSIS — S99911A Unspecified injury of right ankle, initial encounter: Secondary | ICD-10-CM | POA: Diagnosis present

## 2018-02-23 MED ORDER — NAPROXEN 500 MG PO TABS: 500.0000 mg | ORAL_TABLET | Freq: Two times a day (BID) | ORAL | 0 refills | Status: DC

## 2018-02-23 NOTE — Discharge Instructions (Addendum)
Follow-up with Dr. your company's choice or Dr. Deeann Saint who is the orthopedist on call if any continued problems after 1 week.  Begin taking naproxen 500 mg twice daily with food.  Wear ace wrap for added support and protection.  Ice as needed for inflammation and pain.  You may continue walking on your foot as needed.  Follow-up with your supervisor after today's visit.  Your medication was sent to the [facility] on [address].

## 2018-02-23 NOTE — ED Provider Notes (Signed)
[facility] Emergency Department Provider Note  ____________________________________________   First MD Initiated Contact with Patient 02/23/18 [phone]     (approximate)  I have reviewed the triage vital signs and the nursing notes.   HISTORY  Chief Complaint Ankle Pain   HPI Maureen Lopez is a 23 y.o. female presents to the ED with complaint of right ankle pain.  Patient states that while at work she had a crate of milk cartons fall on her ankle 2 days ago.  Patient has continued to have pain since that time.  She denies taking any over-the-counter medications.  She states that she is elevated her ankle.  She denies any previous injury.  She is continued to be ambulatory.  She rates her pain as an 8 out of 10.  Past Medical History:  Diagnosis Date  . Medical history non-contributory     Patient Active Problem List   Diagnosis Date Noted  . Abdominal pain in pregnancy, third trimester 02/09/2017  . Indication for care in labor or delivery 02/09/2017    Past Surgical History:  Procedure Laterality Date  . NO PAST SURGERIES      Prior to Admission medications   Medication Sig Start Date End Date Taking? Authorizing Provider  naproxen (NAPROSYN) 500 MG tablet Take 1 tablet (500 mg total) by mouth 2 (two) times daily with a meal. 02/23/18   Tommi Rumps, PA-C    Allergies Patient has no known allergies.  No family history on file.  Social History Social History   Tobacco Use  . Smoking status: Never Smoker  . Smokeless tobacco: Never Used  Substance Use Topics  . Alcohol use: Yes    Frequency: Never    Comment: occ  . Drug use: No    Review of Systems Constitutional: No fever/chills Cardiovascular: Denies chest pain. Respiratory: Denies shortness of breath. Musculoskeletal: Positive right ankle pain. Skin: Negative for rash. Neurological: Negative for headaches, focal weakness or  numbness. ___________________________________________   PHYSICAL EXAM:  VITAL SIGNS: ED Triage Vitals [02/23/18 0829]  Enc Vitals Group     BP 140/73     Pulse Rate 82     Resp 14     Temp 98.2 F (36.8 C)     Temp Source Oral     SpO2 100 %     Weight 147 lb (66.7 kg)     Height 5\' 3"  (1.6 m)     Head Circumference      Peak Flow      Pain Score 8     Pain Loc      Pain Edu?      Excl. in GC?    Constitutional: Alert and oriented. Well appearing and in no acute distress. Eyes: Conjunctivae are normal.  Head: Atraumatic. Neck: No stridor.   Cardiovascular: Normal rate, regular rhythm. Grossly normal heart sounds.  Good peripheral circulation. Respiratory: Normal respiratory effort.  No retractions. Lungs CTAB. Musculoskeletal: Examination of the right ankle there is no gross deformity and no soft tissue swelling present.  Patient complains of tenderness to palpation both lateral and medial aspect.  Skin is intact and no discoloration or abrasions were noted.  Range of motion is slightly guarded secondary to discomfort but able to flex and extend.  Pulses present.  Motor sensory function intact.  Patient is able to bear weight and ambulate without assistance. Neurologic:  Normal speech and language. No gross focal neurologic deficits are appreciated. No gait instability. Skin:  Skin is warm, dry and intact. No rash noted. Psychiatric: Mood and affect are normal. Speech and behavior are normal.  ____________________________________________   LABS (all labs ordered are listed, but only abnormal results are displayed)  Labs Reviewed - No data to display  RADIOLOGY   Official radiology report(s): Dg Ankle Complete Right  Result Date: 02/23/2018 CLINICAL DATA:  Posttraumatic right ankle pain.  Initial encounter. EXAM: RIGHT ANKLE - COMPLETE 3+ VIEW COMPARISON:  None. FINDINGS: There is no evidence of fracture, dislocation, or joint effusion. There is no evidence of  arthropathy or other focal bone abnormality. Soft tissues are unremarkable. IMPRESSION: Negative. Electronically Signed   By: Marnee Spring M.D.   On: 02/23/2018 09:12    ____________________________________________   PROCEDURES  Procedure(s) performed: None  Procedures  Critical Care performed: No  ____________________________________________   INITIAL IMPRESSION / ASSESSMENT AND PLAN / ED COURSE  As part of my medical decision making, I reviewed the following data within the electronic MEDICAL RECORD NUMBER Notes from prior ED visits and [place] Controlled Substance Database  23 year old female presents to the ED with complaint of work-related injury to her right ankle when a crate of milk cartons fell on her.  She is continued to have pain for the last 2 days.  Patient has not taken any over-the-counter medication.  She has continued to ambulate without any assistance.  On exam there is no gross deformity and no soft tissue edema or discoloration noted.  X-ray is negative for fracture or dislocation.  Patient was made aware and she was placed in an Ace wrap.  She was given a note to return to work.  She was given a prescription for naproxen 500 mg twice daily with food.  She will continue icing and elevation as needed for pain and swelling.  She is to follow-up with her company's doctor or Dr. Deeann Saint who is the orthopedist on call if needed.  ____________________________________________   FINAL CLINICAL IMPRESSION(S) / ED DIAGNOSES  Final diagnoses:  Contusion of right ankle, initial encounter     ED Discharge Orders         Ordered    naproxen (NAPROSYN) 500 MG tablet  2 times daily with meals     02/23/18 0919           Note:  This document was prepared using Dragon voice recognition software and may include unintentional dictation errors.    Tommi Rumps, PA-C 02/23/18 1153    Arnaldo Natal, MD 02/23/18 [phone]

## 2018-02-23 NOTE — ED Triage Notes (Signed)
Pt reports that she works in a [employer] and a carton of milk fell on right ankle 2 days ago - she is continuing with pain to right ankle but is able to bear weight and walk with steady gait - WORKERS COMP

## 2018-02-23 NOTE — ED Notes (Signed)
Company profile ([employer]) pulled and no urine or breath analysis required.

## 2018-03-01 ENCOUNTER — Emergency Department
Admission: EM | Admit: 2018-03-01 | Discharge: 2018-03-01 | Disposition: A | Discharge status: Home / Self Care | Payer: No Typology Code available for payment source | Attending: Emergency Medicine | Admitting: Emergency Medicine

## 2018-03-01 ENCOUNTER — Encounter: Payer: Self-pay | Admitting: Emergency Medicine

## 2018-03-01 DIAGNOSIS — Z7689 Persons encountering health services in other specified circumstances: Secondary | ICD-10-CM | POA: Diagnosis not present

## 2018-03-01 DIAGNOSIS — Z0279 Encounter for issue of other medical certificate: Secondary | ICD-10-CM | POA: Diagnosis present

## 2018-03-01 NOTE — ED Triage Notes (Signed)
Injured right ankle at work one week ago, here today to get a return to work note.

## 2018-03-01 NOTE — Discharge Instructions (Signed)
You may return to work without restrictions.  Return as needed.  See your regular doctor as needed.  Get a yearly physical.

## 2018-03-01 NOTE — ED Provider Notes (Signed)
[facility] Emergency Department Provider Note  ____________________________________________   First MD Initiated Contact with Patient 03/01/18 1750     (approximate)  I have reviewed the triage vital signs and the nursing notes.   HISTORY  Chief Complaint work note    HPI Maureen Lopez is a 23 y.o. female presents emergency department for return to work note.  She states her work will not allow her to come back until we have approved her.  She states she does not have any more ankle pain.  Feels that she can do her job completely.    Past Medical History:  Diagnosis Date  . Medical history non-contributory     Patient Active Problem List   Diagnosis Date Noted  . Abdominal pain in pregnancy, third trimester 02/09/2017  . Indication for care in labor or delivery 02/09/2017    Past Surgical History:  Procedure Laterality Date  . NO PAST SURGERIES      Prior to Admission medications   Medication Sig Start Date End Date Taking? Authorizing Provider  naproxen (NAPROSYN) 500 MG tablet Take 1 tablet (500 mg total) by mouth 2 (two) times daily with a meal. 02/23/18   Tommi Rumps, PA-C    Allergies Patient has no known allergies.  No family history on file.  Social History Social History   Tobacco Use  . Smoking status: Never Smoker  . Smokeless tobacco: Never Used  Substance Use Topics  . Alcohol use: Yes    Frequency: Never    Comment: occ  . Drug use: No    Review of Systems  Constitutional: No fever/chills Eyes: No visual changes. ENT: No sore throat. Respiratory: Denies cough Genitourinary: Negative for dysuria. Musculoskeletal: Negative for back pain.  Denies ankle pain Skin: Negative for rash.    ____________________________________________   PHYSICAL EXAM:  VITAL SIGNS: ED Triage Vitals  Enc Vitals Group     BP 03/01/18 1740 140/73     Pulse Rate 03/01/18 1740 78     Resp 03/01/18 1740 14   Temp 03/01/18 1740 98.7 F (37.1 C)     Temp Source 03/01/18 1740 Oral     SpO2 03/01/18 1740 100 %     Weight 03/01/18 1739 147 lb (66.7 kg)     Height 03/01/18 1739 5\' 3"  (1.6 m)     Head Circumference --      Peak Flow --      Pain Score 03/01/18 1739 0     Pain Loc --      Pain Edu? --      Excl. in GC? --     Constitutional: Alert and oriented. Well appearing and in no acute distress. Eyes: Conjunctivae are normal.  Head: Atraumatic. Nose: No congestion/rhinnorhea. Mouth/Throat: Mucous membranes are moist.   Neck:  supple no lymphadenopathy noted Cardiovascular: Normal rate, regular rhythm. Respiratory: Normal respiratory effort.  No retractions, GU: deferred Musculoskeletal: FROM all extremities, warm and well perfused, ankles are nontender, she is able to walk on her toes and heels without difficulty.  No limp noted. Neurologic:  Normal speech and language.  Skin:  Skin is warm, dry and intact. No rash noted. Psychiatric: Mood and affect are normal. Speech and behavior are normal.  ____________________________________________   LABS (all labs ordered are listed, but only abnormal results are displayed)  Labs Reviewed - No data to display ____________________________________________   ____________________________________________  RADIOLOGY    ____________________________________________   PROCEDURES  Procedure(s) performed: No  Procedures    ____________________________________________   INITIAL IMPRESSION / ASSESSMENT AND PLAN / ED COURSE  Pertinent labs & imaging results that were available during my care of the patient were reviewed by me and considered in my medical decision making (see chart for details).   Patient is a 23 year old female presents emergency department for return to work note.  Patient appears well and healthy.  She is able to move all extremities without difficulty.  Return to work without restrictions note was provided for  the patient.  She is discharged stable condition.     As part of my medical decision making, I reviewed the following data within the electronic MEDICAL RECORD NUMBER Nursing notes reviewed and incorporated, Old chart reviewed, Notes from prior ED visits and [place] Controlled Substance Database  ____________________________________________   FINAL CLINICAL IMPRESSION(S) / ED DIAGNOSES  Final diagnoses:  Return to work evaluation      NEW MEDICATIONS STARTED DURING THIS VISIT:  New Prescriptions   No medications on file     Note:  This document was prepared using Dragon voice recognition software and may include unintentional dictation errors.    Faythe Ghee, PA-C 03/01/18 1813    Schaevitz, Myra Rude, MD 03/01/18 [phone]

## 2018-05-24 ENCOUNTER — Other Ambulatory Visit: Payer: Self-pay

## 2018-05-24 ENCOUNTER — Emergency Department: Payer: Self-pay

## 2018-05-24 ENCOUNTER — Emergency Department
Admission: EM | Admit: 2018-05-24 | Discharge: 2018-05-24 | Disposition: A | Discharge status: Home / Self Care | Payer: Self-pay | Attending: Emergency Medicine | Admitting: Emergency Medicine

## 2018-05-24 DIAGNOSIS — N939 Abnormal uterine and vaginal bleeding, unspecified: Secondary | ICD-10-CM | POA: Insufficient documentation

## 2018-05-24 DIAGNOSIS — R102 Pelvic and perineal pain: Secondary | ICD-10-CM | POA: Insufficient documentation

## 2018-05-24 DIAGNOSIS — D509 Iron deficiency anemia, unspecified: Secondary | ICD-10-CM | POA: Insufficient documentation

## 2018-05-24 LAB — CBC
HCT: 29 % — ABNORMAL LOW (ref 36.0–46.0)
Hemoglobin: 7.8 g/dL — ABNORMAL LOW (ref 12.0–15.0)
MCH: 17.4 pg — ABNORMAL LOW (ref 26.0–34.0)
MCHC: 26.9 g/dL — ABNORMAL LOW (ref 30.0–36.0)
MCV: 64.9 fL — ABNORMAL LOW (ref 80.0–100.0)
Platelets: 186 10*3/uL (ref 150–400)
RBC: 4.47 MIL/uL (ref 3.87–5.11)
RDW: 19.4 % — ABNORMAL HIGH (ref 11.5–15.5)
WBC: 7 10*3/uL (ref 4.0–10.5)
nRBC: 0 % (ref 0.0–0.2)

## 2018-05-24 LAB — URINALYSIS, COMPLETE (UACMP) WITH MICROSCOPIC
Bacteria, UA: NONE SEEN
Bilirubin Urine: NEGATIVE
Glucose, UA: NEGATIVE mg/dL
Ketones, ur: NEGATIVE mg/dL
Nitrite: NEGATIVE
Protein, ur: NEGATIVE mg/dL
Specific Gravity, Urine: 1.026 (ref 1.005–1.030)
pH: 6 (ref 5.0–8.0)

## 2018-05-24 LAB — COMPREHENSIVE METABOLIC PANEL
ALT: 11 U/L (ref 0–44)
AST: 14 U/L — ABNORMAL LOW (ref 15–41)
Albumin: 4.2 g/dL (ref 3.5–5.0)
Alkaline Phosphatase: 53 U/L (ref 38–126)
Anion gap: 5 (ref 5–15)
BUN: 12 mg/dL (ref 6–20)
CO2: 24 mmol/L (ref 22–32)
Calcium: 9.1 mg/dL (ref 8.9–10.3)
Chloride: 112 mmol/L — ABNORMAL HIGH (ref 98–111)
Creatinine, Ser: 0.67 mg/dL (ref 0.44–1.00)
GFR calc Af Amer: >60 mL/min (ref >60)
GFR calc non Af Amer: >60 mL/min (ref >60)
Glucose, Bld: 100 mg/dL — ABNORMAL HIGH (ref 70–99)
Potassium: 3.6 mmol/L (ref 3.5–5.1)
Sodium: 141 mmol/L (ref 135–145)
Total Bilirubin: 0.4 mg/dL (ref 0.3–1.2)
Total Protein: 7.5 g/dL (ref 6.5–8.1)

## 2018-05-24 LAB — WET PREP, GENITAL
Clue Cells Wet Prep HPF POC: NONE SEEN
Sperm: NONE SEEN
Trich, Wet Prep: NONE SEEN
Yeast Wet Prep HPF POC: NONE SEEN

## 2018-05-24 LAB — HCG, QUANTITATIVE, PREGNANCY: hCG, Beta Chain, Quant, S: <1 m[IU]/mL (ref <5)

## 2018-05-24 LAB — CHLAMYDIA/NGC RT PCR (ARMC ONLY)
Chlamydia Tr: DETECTED — AB
N gonorrhoeae: NOT DETECTED

## 2018-05-24 LAB — LIPASE, BLOOD: Lipase: 33 U/L (ref 11–51)

## 2018-05-24 MED ORDER — FERROUS SULFATE DRIED ER 160 (50 FE) MG PO TBCR: 160.0000 mg | EXTENDED_RELEASE_TABLET | ORAL | 6 refills | Status: AC

## 2018-05-24 MED ORDER — OXYCODONE-ACETAMINOPHEN 5-325 MG PO TABS
2.0000 | ORAL_TABLET | Freq: Once | ORAL | Status: AC
  Administered 2018-05-24: 12:00:00 2 via ORAL
  Filled 2018-05-24: qty 2

## 2018-05-24 MED ORDER — IBUPROFEN 800 MG PO TABS: 800.0000 mg | ORAL_TABLET | Freq: Three times a day (TID) | ORAL | 0 refills | Status: DC | PRN

## 2018-05-24 MED ORDER — ONDANSETRON 4 MG PO TBDP
4.0000 mg | ORAL_TABLET | Freq: Once | ORAL | Status: AC
  Administered 2018-05-24: 4 mg via ORAL
  Filled 2018-05-24: qty 1

## 2018-05-24 MED ORDER — CEFTRIAXONE SODIUM 250 MG IJ SOLR
250.0000 mg | INTRAMUSCULAR | Status: DC
  Administered 2018-05-24: 250 mg via INTRAMUSCULAR
  Filled 2018-05-24: qty 250

## 2018-05-24 MED ORDER — AZITHROMYCIN 500 MG PO TABS
1000.0000 mg | ORAL_TABLET | Freq: Once | ORAL | Status: AC
  Administered 2018-05-24: 16:00:00 1000 mg via ORAL
  Filled 2018-05-24: qty 2

## 2018-05-24 NOTE — ED Triage Notes (Addendum)
Lower abdominal pain X 2 days, worsening today. Denies NVD. Pt alert and oriented X4, active, cooperative, pt in NAD. RR even and unlabored, color WNL.    States she started with vaginal bleeding today, when asked if pain feels similar to period, states "I can't even tell". Period approx 1 month ago. Lower abdominal cramping.

## 2018-05-24 NOTE — ED Provider Notes (Signed)
[facility] Emergency Department Provider Note       Time seen: ----------------------------------------- 11:08 AM on 05/24/2018 -----------------------------------------   I have reviewed the triage vital signs and the nursing notes.  HISTORY   Chief Complaint Abdominal Pain    HPI Maureen Lopez is a 23 y.o. female with no significant past medical history who presents to the ED for lower abdominal pain for 2 days that is worsened today.  She denies any fevers, chills, vomiting or diarrhea.  Patient states she started with vaginal bleeding today.  Last menstrual cycle was 1 month ago.  Past Medical History:  Diagnosis Date  . Medical history non-contributory     Patient Active Problem List   Diagnosis Date Noted  . Abdominal pain in pregnancy, third trimester 02/09/2017  . Indication for care in labor or delivery 02/09/2017    Past Surgical History:  Procedure Laterality Date  . NO PAST SURGERIES      Allergies Patient has no known allergies.  Social History Social History   Tobacco Use  . Smoking status: Never Smoker  . Smokeless tobacco: Never Used  Substance Use Topics  . Alcohol use: Yes    Frequency: Never    Comment: occ  . Drug use: No   Review of Systems Constitutional: Negative for fever. Cardiovascular: Negative for chest pain. Respiratory: Negative for shortness of breath. Gastrointestinal: Positive for abdominal pain Genitourinary: Negative for dysuria.  Positive for vaginal bleeding Musculoskeletal: Negative for back pain. Skin: Negative for rash. Neurological: Negative for headaches, focal weakness or numbness.  All systems negative/normal/unremarkable except as stated in the HPI  ____________________________________________   PHYSICAL EXAM:  VITAL SIGNS: ED Triage Vitals  Enc Vitals Group     BP 05/24/18 1101 (!) 146/46     Pulse --      Resp 05/24/18 1101 18     Temp 05/24/18 1101 98.2 F (36.8  C)     Temp Source 05/24/18 1101 Oral     SpO2 05/24/18 1101 100 %     Weight 05/24/18 1102 150 lb (68 kg)     Height 05/24/18 1102 5\' 3"  (1.6 m)     Head Circumference --      Peak Flow --      Pain Score 05/24/18 1102 6     Pain Loc --      Pain Edu? --      Excl. in GC? --    Constitutional: Alert and oriented. Well appearing and in no distress. Eyes: Conjunctivae are normal. Normal extraocular movements. ENT      Head: Normocephalic and atraumatic.      Nose: No congestion/rhinnorhea.      Mouth/Throat: Mucous membranes are moist.      Neck: No stridor. Cardiovascular: Normal rate, regular rhythm. No murmurs, rubs, or gallops. Respiratory: Normal respiratory effort without tachypnea nor retractions. Breath sounds are clear and equal bilaterally. No wheezes/rales/rhonchi. Gastrointestinal: Soft and nontender. Normal bowel sounds Genitourinary: Cervical motion tenderness, vaginal bleeding and discharge is noted Musculoskeletal: Nontender with normal range of motion in extremities. No lower extremity tenderness nor edema. Neurologic:  Normal speech and language. No gross focal neurologic deficits are appreciated.  Skin:  Skin is warm, dry and intact. No rash noted. Psychiatric: Mood and affect are normal. Speech and behavior are normal.  ___________________________________________  ED COURSE:  As part of my medical decision making, I reviewed the following data within the electronic MEDICAL RECORD NUMBER History obtained from family if  available, nursing notes, old chart and ekg, as well as notes from prior ED visits. Patient presented for abdominal pain and vaginal bleeding, we will assess with labs and imaging as indicated at this time.   Procedures  Maureen Lopez was evaluated in Emergency Department on 05/24/2018 for the symptoms described in the history of present illness. She was evaluated in the context of the global COVID-19 pandemic, which necessitated consideration  that the patient might be at risk for infection with the SARS-CoV-2 virus that causes COVID-19. Institutional protocols and algorithms that pertain to the evaluation of patients at risk for COVID-19 are in a state of rapid change based on information released by regulatory bodies including the CDC and federal and state organizations. These policies and algorithms were followed during the patient's care in the ED.  ____________________________________________   LABS (pertinent positives/negatives)  Labs Reviewed  WET PREP, GENITAL - Abnormal; Notable for the following components:      Result Value   WBC, Wet Prep HPF POC FEW (*)    All other components within normal limits  COMPREHENSIVE METABOLIC PANEL - Abnormal; Notable for the following components:   Chloride 112 (*)    Glucose, Bld 100 (*)    AST 14 (*)    All other components within normal limits  CBC - Abnormal; Notable for the following components:   Hemoglobin 7.8 (*)    HCT 29.0 (*)    MCV 64.9 (*)    MCH 17.4 (*)    MCHC 26.9 (*)    RDW 19.4 (*)    All other components within normal limits  URINALYSIS, COMPLETE (UACMP) WITH MICROSCOPIC - Abnormal; Notable for the following components:   Color, Urine YELLOW (*)    APPearance HAZY (*)    Hgb urine dipstick MODERATE (*)    Leukocytes,Ua TRACE (*)    All other components within normal limits  CHLAMYDIA/NGC RT PCR ([facility] ONLY)  LIPASE, BLOOD  HCG, QUANTITATIVE, PREGNANCY  POC URINE PREG, ED    RADIOLOGY Images were viewed by me  Pelvic ultrasound is unremarkable  ____________________________________________   DIFFERENTIAL DIAGNOSIS   Dysmenorrhea, pregnancy, ectopic pregnancy, PID  FINAL ASSESSMENT AND PLAN  Abdominal pain, vaginal bleeding, iron deficiency anemia   Plan: The patient had presented for abdominal pain and vaginal bleeding. Patient's labs did reveal significant iron deficiency anemia but no other acute process. Patient's imaging is  reassuring.  She will be treated with NSAIDs as well as iron to start taking daily.  I did give her Rocephin and Zithromax to cover for STD.  She is cleared for outpatient follow-up.   Ulice DashJohnathan E Williams, MD    Note: This note was generated in part or whole with voice recognition software. Voice recognition is usually quite accurate but there are transcription errors that can and very often do occur. I apologize for any typographical errors that were not detected and corrected.     Emily FilbertWilliams, Jonathan E, MD 05/24/18 1500

## 2018-05-24 NOTE — ED Notes (Signed)
Pt could not sign on pad- printed the discharge form and had pt sign

## 2018-05-25 ENCOUNTER — Telehealth: Payer: Self-pay | Admitting: Emergency Medicine

## 2018-05-25 NOTE — Telephone Encounter (Signed)
Called patient and informed her of positive chlamydia.  She was treated during ED visit.  Explained need for partner treatment including available free treatment at [facility].

## 2018-09-17 ENCOUNTER — Emergency Department
Admission: EM | Admit: 2018-09-17 | Discharge: 2018-09-17 | Disposition: A | Discharge status: Home / Self Care | Payer: Self-pay | Attending: Emergency Medicine | Admitting: Emergency Medicine

## 2018-09-17 ENCOUNTER — Other Ambulatory Visit: Payer: Self-pay

## 2018-09-17 ENCOUNTER — Encounter: Payer: Self-pay | Admitting: Intensive Care

## 2018-09-17 DIAGNOSIS — N760 Acute vaginitis: Secondary | ICD-10-CM | POA: Insufficient documentation

## 2018-09-17 DIAGNOSIS — N72 Inflammatory disease of cervix uteri: Secondary | ICD-10-CM | POA: Insufficient documentation

## 2018-09-17 DIAGNOSIS — B9689 Other specified bacterial agents as the cause of diseases classified elsewhere: Secondary | ICD-10-CM | POA: Insufficient documentation

## 2018-09-17 DIAGNOSIS — N898 Other specified noninflammatory disorders of vagina: Secondary | ICD-10-CM | POA: Insufficient documentation

## 2018-09-17 DIAGNOSIS — R519 Headache, unspecified: Secondary | ICD-10-CM

## 2018-09-17 DIAGNOSIS — R102 Pelvic and perineal pain: Secondary | ICD-10-CM | POA: Insufficient documentation

## 2018-09-17 LAB — URINALYSIS, COMPLETE (UACMP) WITH MICROSCOPIC
Bacteria, UA: NONE SEEN
Bilirubin Urine: NEGATIVE
Glucose, UA: NEGATIVE mg/dL
Hgb urine dipstick: NEGATIVE
Ketones, ur: NEGATIVE mg/dL
Nitrite: NEGATIVE
Protein, ur: NEGATIVE mg/dL
Specific Gravity, Urine: 1.026 (ref 1.005–1.030)
pH: 5 (ref 5.0–8.0)

## 2018-09-17 LAB — WET PREP, GENITAL
Sperm: NONE SEEN
Trich, Wet Prep: NONE SEEN
Yeast Wet Prep HPF POC: NONE SEEN

## 2018-09-17 LAB — POCT PREGNANCY, URINE: Preg Test, Ur: NEGATIVE

## 2018-09-17 MED ORDER — CEFTRIAXONE SODIUM 1 G IJ SOLR
500.0000 mg | Freq: Once | INTRAMUSCULAR | Status: AC
  Administered 2018-09-17: 500 mg via INTRAMUSCULAR
  Filled 2018-09-17: qty 10

## 2018-09-17 MED ORDER — LIDOCAINE HCL (PF) 1 % IJ SOLN
2.1000 mL | Freq: Once | INTRAMUSCULAR | Status: AC
  Administered 2018-09-17: 2.1 mL
  Filled 2018-09-17: qty 5

## 2018-09-17 MED ORDER — AZITHROMYCIN 500 MG PO TABS
1000.0000 mg | ORAL_TABLET | Freq: Every day | ORAL | Status: DC
  Administered 2018-09-17: 11:00:00 1000 mg via ORAL
  Filled 2018-09-17: qty 2

## 2018-09-17 MED ORDER — METRONIDAZOLE 500 MG PO TABS: 500.0000 mg | ORAL_TABLET | Freq: Two times a day (BID) | ORAL | 0 refills | Status: AC

## 2018-09-17 MED ORDER — NAPROXEN 500 MG PO TABS
500.0000 mg | ORAL_TABLET | Freq: Once | ORAL | Status: AC
  Administered 2018-09-17: 500 mg via ORAL
  Filled 2018-09-17: qty 1

## 2018-09-17 NOTE — ED Triage Notes (Addendum)
Patient c/o sharp migraine pain since yesterday. No history migraines. Patient also c/o slight pelvic discomfort with foul smelling discharge. Patient would like to be checked for STDs

## 2018-09-17 NOTE — ED Provider Notes (Signed)
[facility] Emergency Department Provider Note  ____________________________________________  Time seen: Approximately 9:39 AM  I have reviewed the triage vital signs and the nursing notes.   HISTORY  Chief Complaint Migraine and SEXUALLY TRANSMITTED DISEASE    HPI Maureen Lopez is a 23 y.o. female presents to the emergency department for evaluation of headache that started yesterday.  She is also complaining of pelvic pain with malodorous vaginal discharge.  She requests to be checked for STDs.  For her headache, no alleviating measures attempted prior to arrival.   Past Medical History:  Diagnosis Date  . Medical history non-contributory     Patient Active Problem List   Diagnosis Date Noted  . Abdominal pain in pregnancy, third trimester 02/09/2017  . Indication for care in labor or delivery 02/09/2017    Past Surgical History:  Procedure Laterality Date  . NO PAST SURGERIES      Prior to Admission medications   Medication Sig Start Date End Date Taking? Authorizing Provider  ferrous sulfate (EQL SLOW RELEASE IRON) 160 (50 Fe) MG TBCR SR tablet Take 1 tablet (160 mg total) by mouth every other day. 05/24/18   Emily FilbertWilliams, Jonathan E, MD    Allergies Patient has no known allergies.  History reviewed. No pertinent family history.  Social History Social History   Tobacco Use  . Smoking status: Never Smoker  . Smokeless tobacco: Never Used  Substance Use Topics  . Alcohol use: Yes    Frequency: Never    Comment: occ  . Drug use: No    Review of Systems Constitutional: Negative for fever. Respiratory: Negative for shortness of breath or cough. Gastrointestinal: Positive for pelvic pain; negative for nausea , negative for vomiting. Genitourinary: Positive for dysuria , positive for vaginal discharge. Musculoskeletal: Negative for back pain. Skin: Negative for acute skin  changes/rash/lesion. ____________________________________________   PHYSICAL EXAM:  VITAL SIGNS: ED Triage Vitals  Enc Vitals Group     BP 09/17/18 0914 136/79     Pulse Rate 09/17/18 0914 92     Resp 09/17/18 0914 14     Temp 09/17/18 0914 100 F (37.8 C)     Temp Source 09/17/18 0914 Oral     SpO2 09/17/18 0914 98 %     Weight 09/17/18 0915 140 lb (63.5 kg)     Height 09/17/18 0915 5\' 3"  (1.6 m)     Head Circumference --      Peak Flow --      Pain Score 09/17/18 0915 8     Pain Loc --      Pain Edu? --      Excl. in GC? --     Constitutional: Alert and oriented. Well appearing and in no acute distress. Eyes: Conjunctivae are normal. Head: Atraumatic. Nose: No congestion/rhinnorhea. Mouth/Throat: Mucous membranes are moist. Respiratory: Normal respiratory effort.  No retractions. Gastrointestinal: Bowel sounds active x 4; Abdomen is soft without rebound or guarding. Genitourinary: Pelvic exam: White, thin discharge in vaginal vault. No cervical lesions noted. No CMT. Musculoskeletal: No extremity tenderness nor edema.  Neurologic:  Normal speech and language. No gross focal neurologic deficits are appreciated. Speech is normal. No gait instability. Headache is diffuse. Skin:  Skin is warm, dry and intact. No rash noted on exposed skin. Psychiatric: Mood and affect are normal. Speech and behavior are normal.  ____________________________________________   LABS (all labs ordered are listed, but only abnormal results are displayed)  Labs Reviewed  CHLAMYDIA/NGC RT PCR [facility]  ONLY)  URINALYSIS, COMPLETE (UACMP) WITH MICROSCOPIC  POC URINE PREG, ED  POCT PREGNANCY, URINE   ____________________________________________  RADIOLOGY  Not indicated. ____________________________________________  Procedures  ____________________________________________  23 year old female presenting to the emergency department for treatment and evaluation of headache and  vaginal discharge.  See HPI for further details.  While here, she was given Naprosyn with some relief of her headache.  Wet prep does confirm that she has bacterial vaginosis.  Chlamydia and gonorrhea testing is now send out but she will be treated based on symptoms.  She will receive Rocephin and azithromycin.  She was encouraged to follow-up with the health department in 2 weeks.  She was advised to return to the emergency department for symptoms of change or worsen if she is unable to schedule an appointment.  INITIAL IMPRESSION / ASSESSMENT AND PLAN / ED COURSE  Pertinent labs & imaging results that were available during my care of the patient were reviewed by me and considered in my medical decision making (see chart for details).  ____________________________________________   FINAL CLINICAL IMPRESSION(S) / ED DIAGNOSES  Final diagnoses:  None    Note:  This document was prepared using Dragon voice recognition software and may include unintentional dictation errors.   Victorino Dike, FNP 09/17/18 1041    Carrie Mew, MD 09/17/18 [phone]

## 2018-09-17 NOTE — ED Notes (Signed)
See triage note  States she developed intermittent sharp h/a yesterday  Pain started at temporal area  Denies any hx of h/a ,n/v or fever    Low grade temp noted on arrival

## 2018-09-21 LAB — GC/CHLAMYDIA PROBE AMP
Chlamydia trachomatis, NAA: NEGATIVE
Neisseria Gonorrhoeae by PCR: POSITIVE — AB

## 2018-09-25 ENCOUNTER — Telehealth: Payer: Self-pay | Admitting: Emergency Medicine

## 2018-09-25 NOTE — Telephone Encounter (Signed)
Called patient to inform of std test results positive for gonorrhea.  She was treated during visit.  Left message.

## 2019-06-24 IMAGING — US US PELVIS COMPLETE
1 series · 14 of 25 positions shown · non-contrast
Comparison: None.

CLINICAL DATA: Severe pelvic pain and vaginal bleeding for the past
day.



[Series 1: us pelvis complete · 0.20mm/px · 14 of 123 slices shown]
[im 1/123]
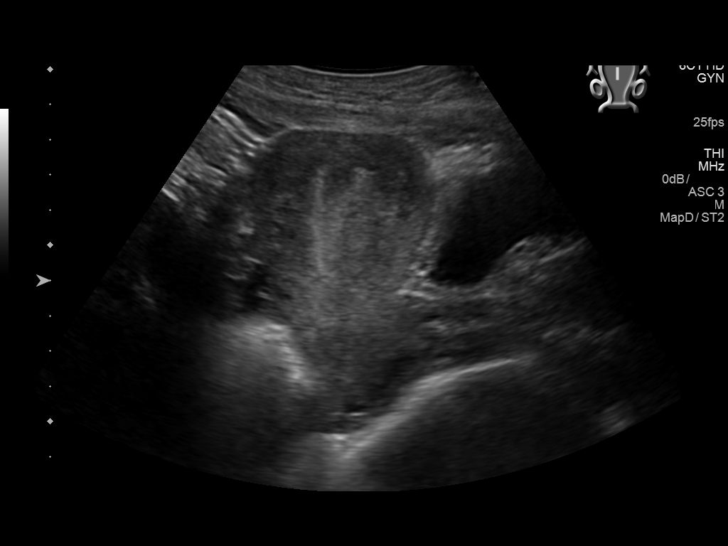
[im 11/123]
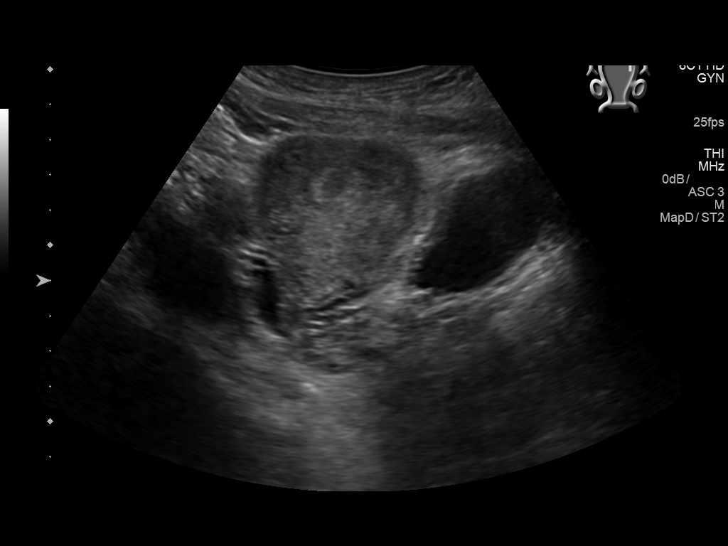
[im 21/123]
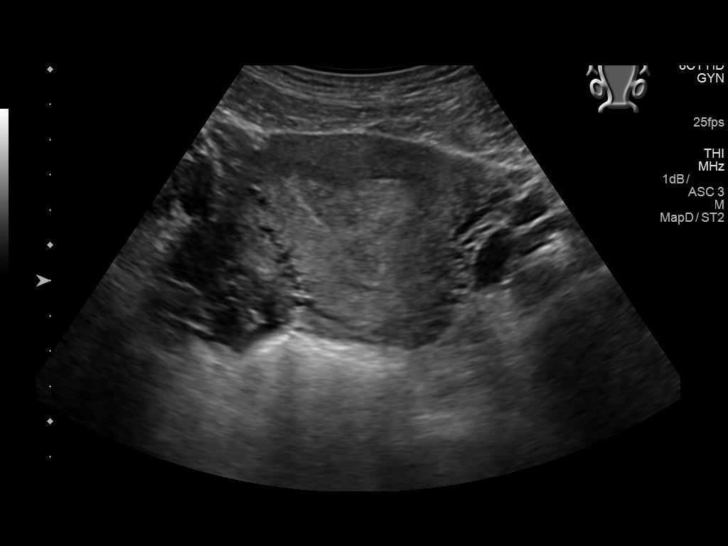
[im 31/123]
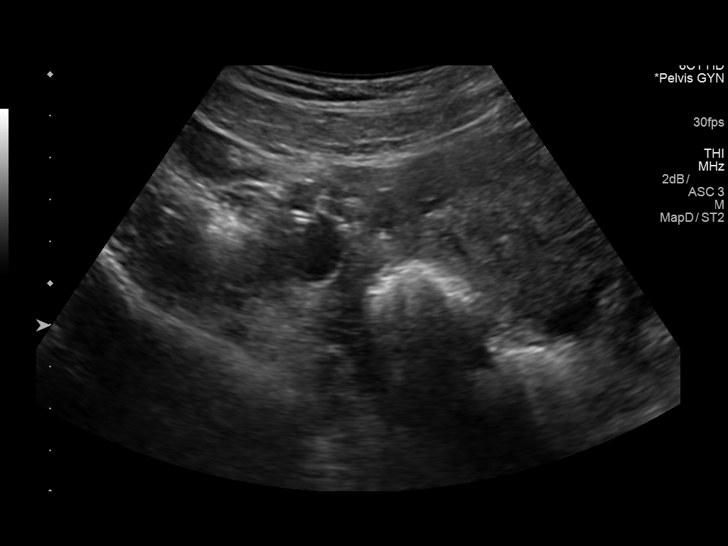
[im 41/123]
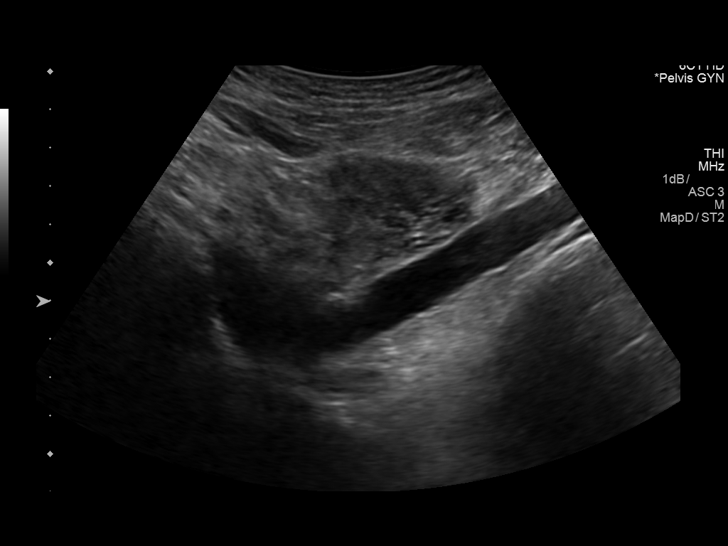
[im 46/123]
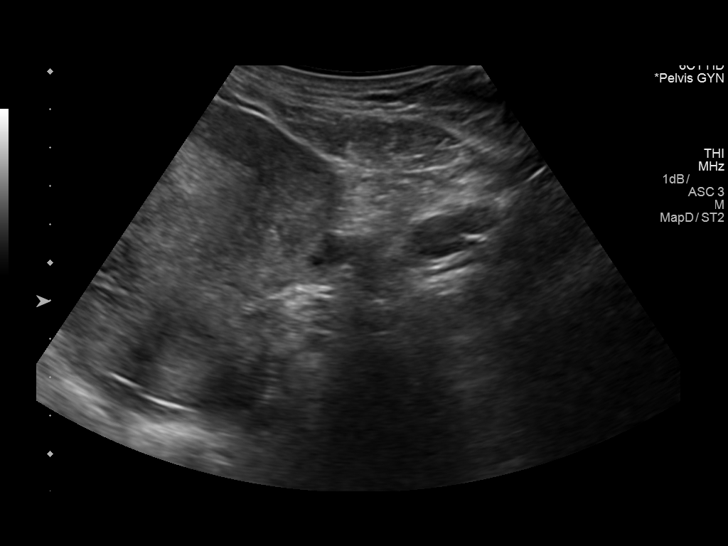
[im 56/123]
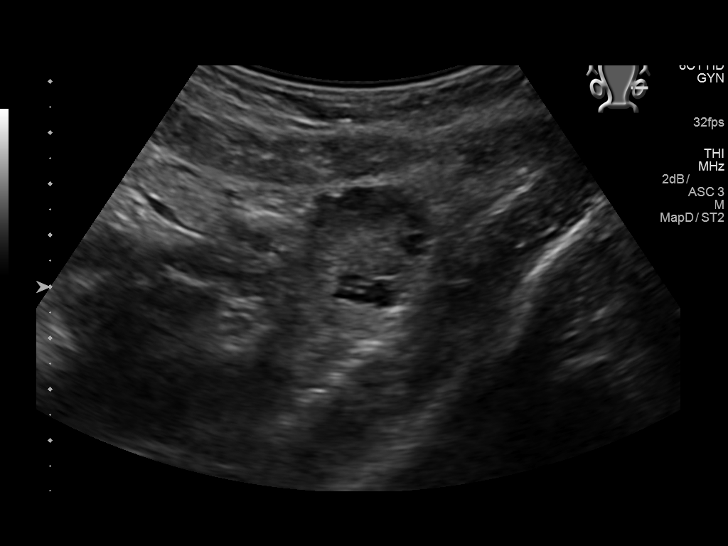
[im 67/123]
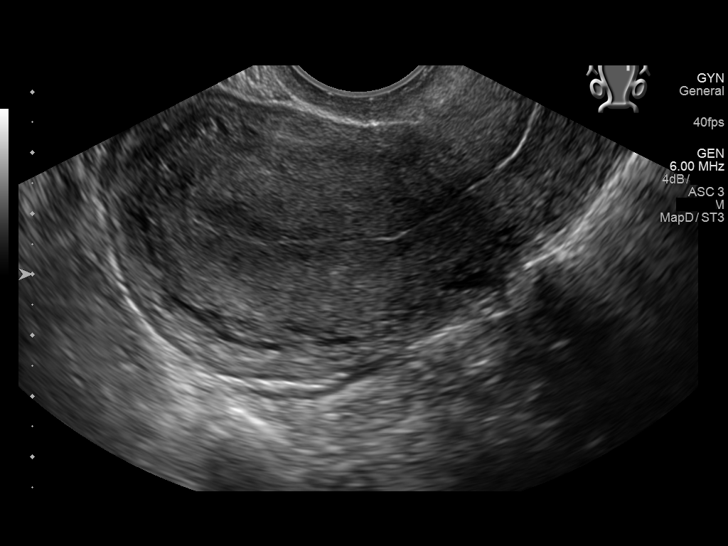
[im 77/123]
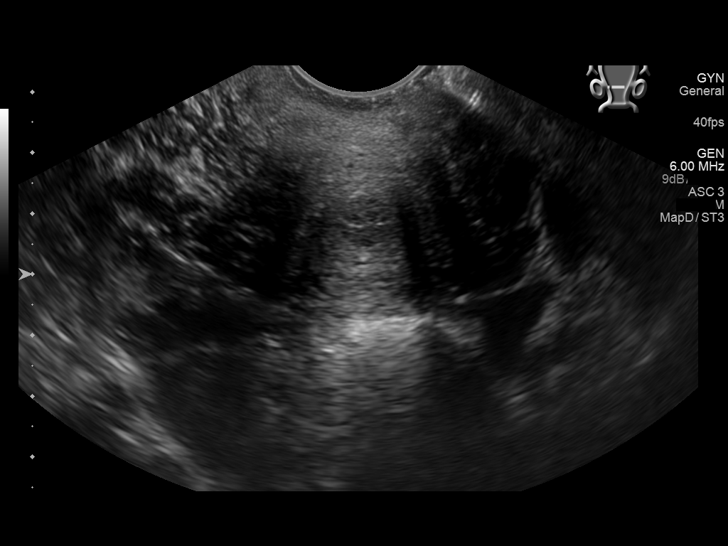
[im 82/123]
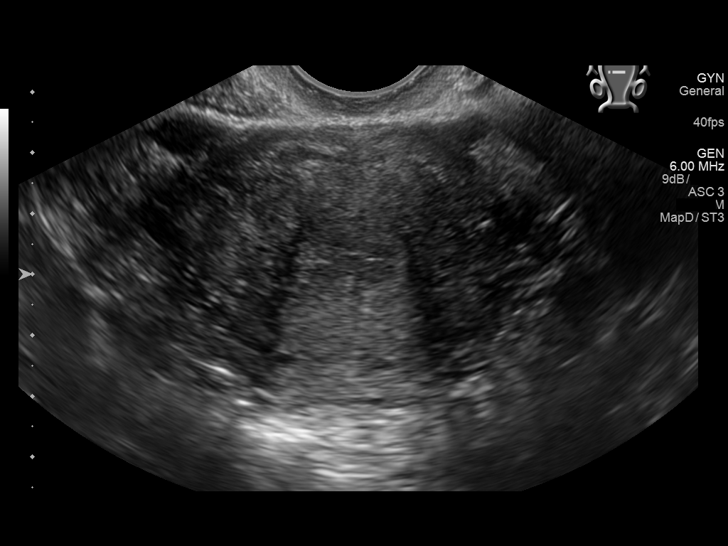
[im 92/123]
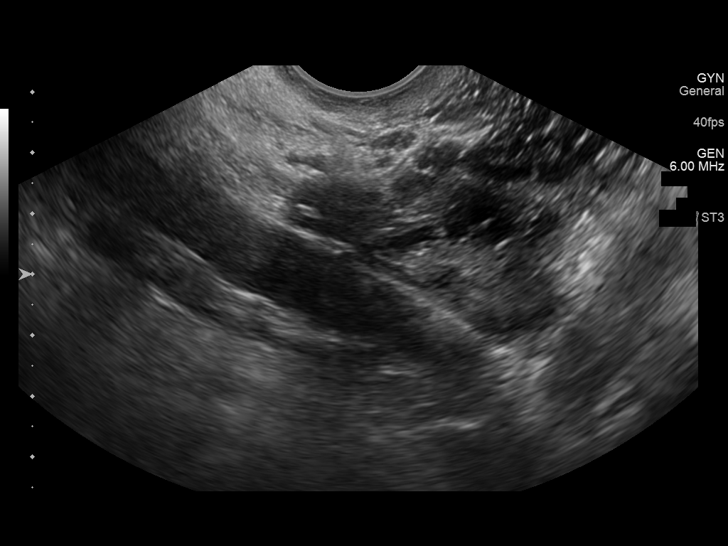
[im 102/123]
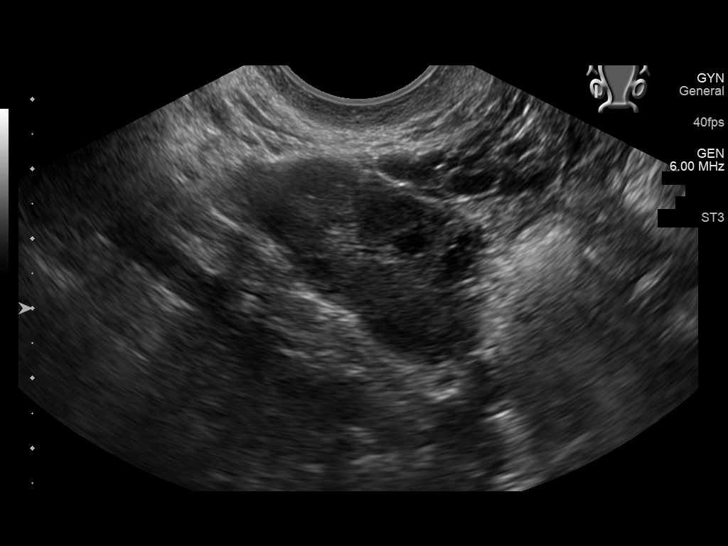
[im 112/123]
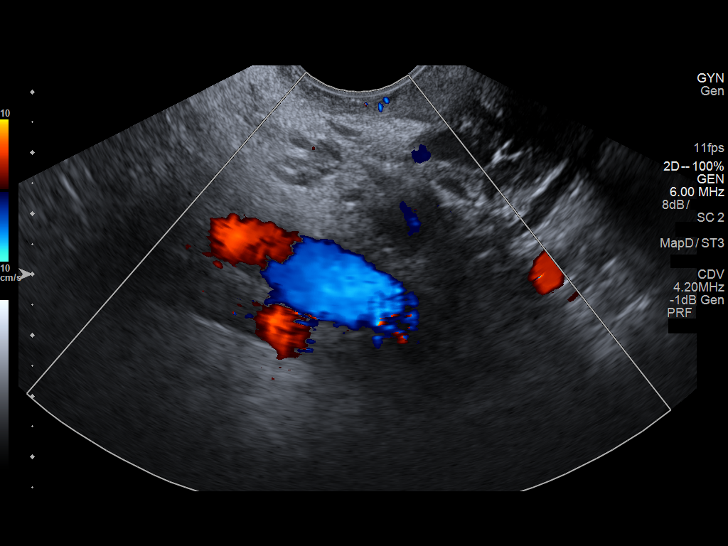
[im 123/123]
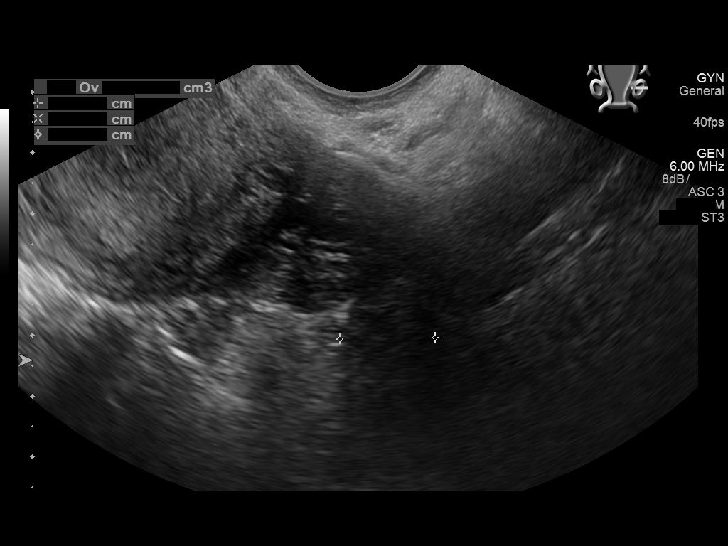

[14 of 25 positions shown; findings below may reference images not displayed]

FINDINGS: Uterus

Measurements: 8.8 x 4.4 x 5.8 cm = volume: 118 mL. No fibroids or
other mass visualized.

Endometrium

Thickness: 1 mm.  No focal abnormality visualized.

Right ovary

Measurements: 3.8 x 2.2 x 2.1 cm = volume: 9 mL. Normal
appearance/no adnexal mass.

Left ovary

Measurements: 5.6 x 2.3 x 2.4 cm = volume: 17 mL. Normal
appearance/no adnexal mass.

Other findings

Small amount of free fluid in the pelvis, likely physiologic.
IMPRESSION: 1. Normal pelvic ultrasound.

## 2020-12-09 IMAGING — DX DG ANKLE COMPLETE 3+V*R*
3 series · 3 of 3 positions shown · non-contrast
Comparison: None.

CLINICAL DATA: Posttraumatic right ankle pain.  Initial encounter.

EXAM:
RIGHT ANKLE - COMPLETE 3+ VIEW

[ankle ap]
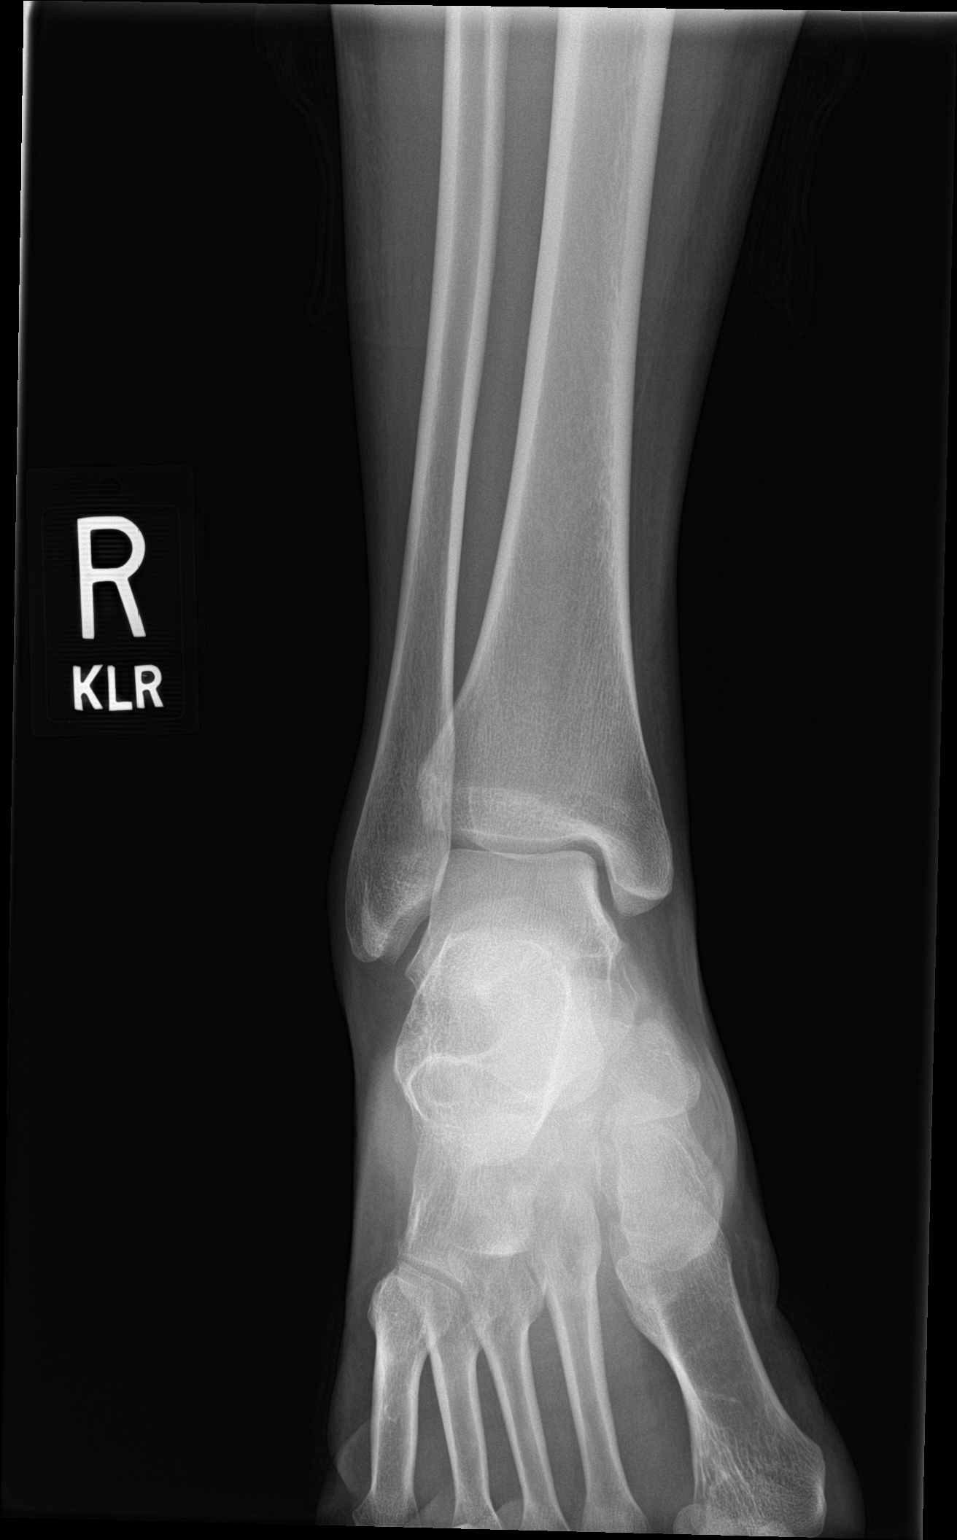

[ankle obl]
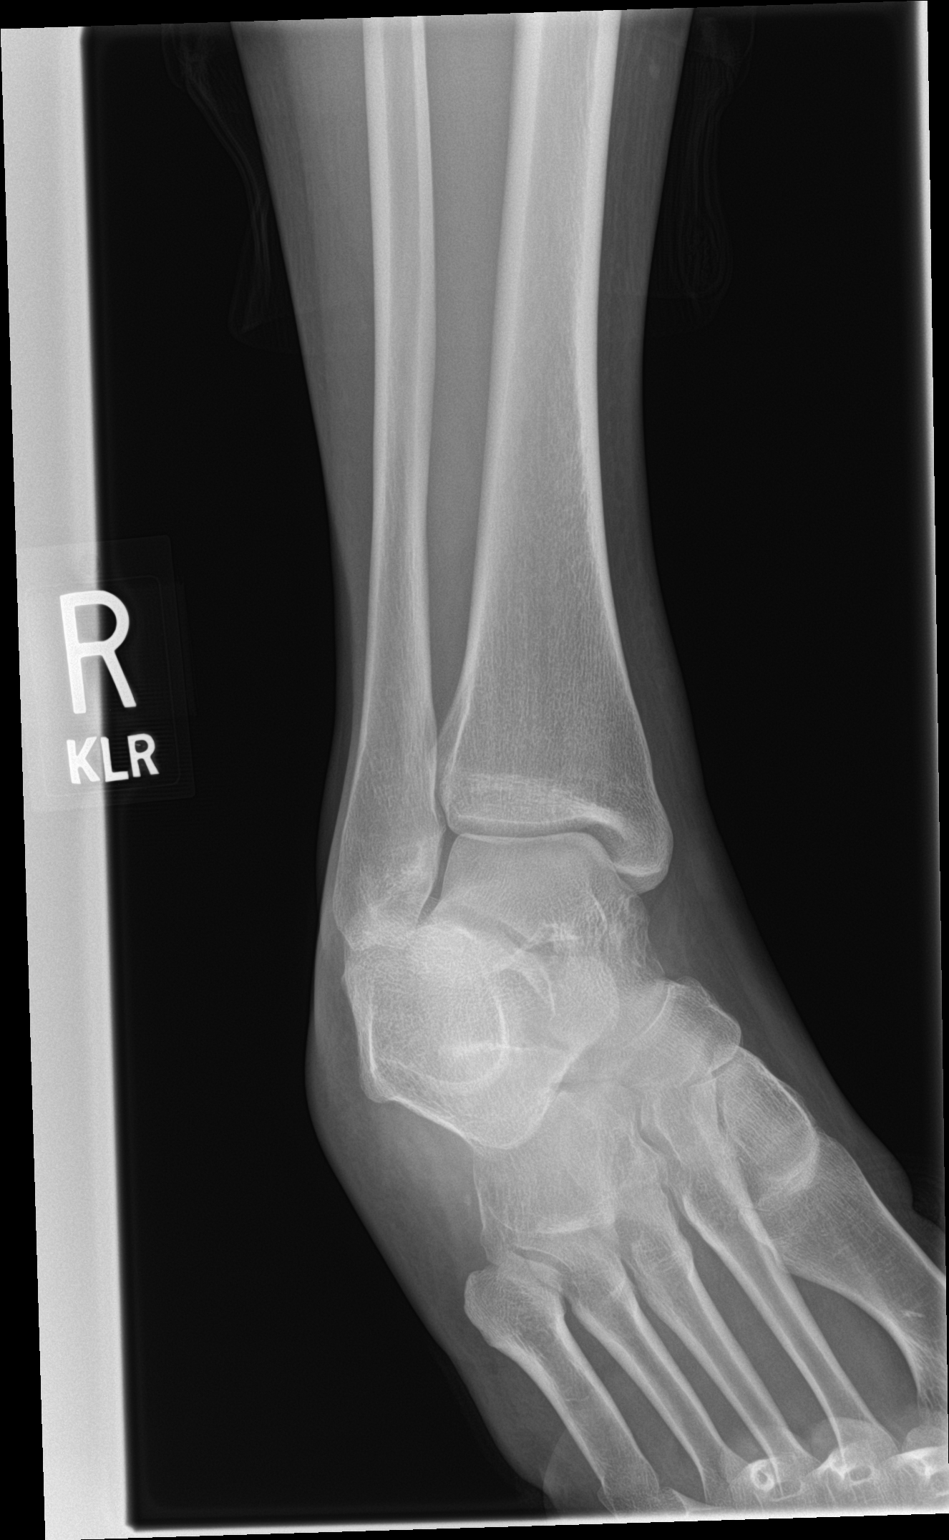

[ankle lat]
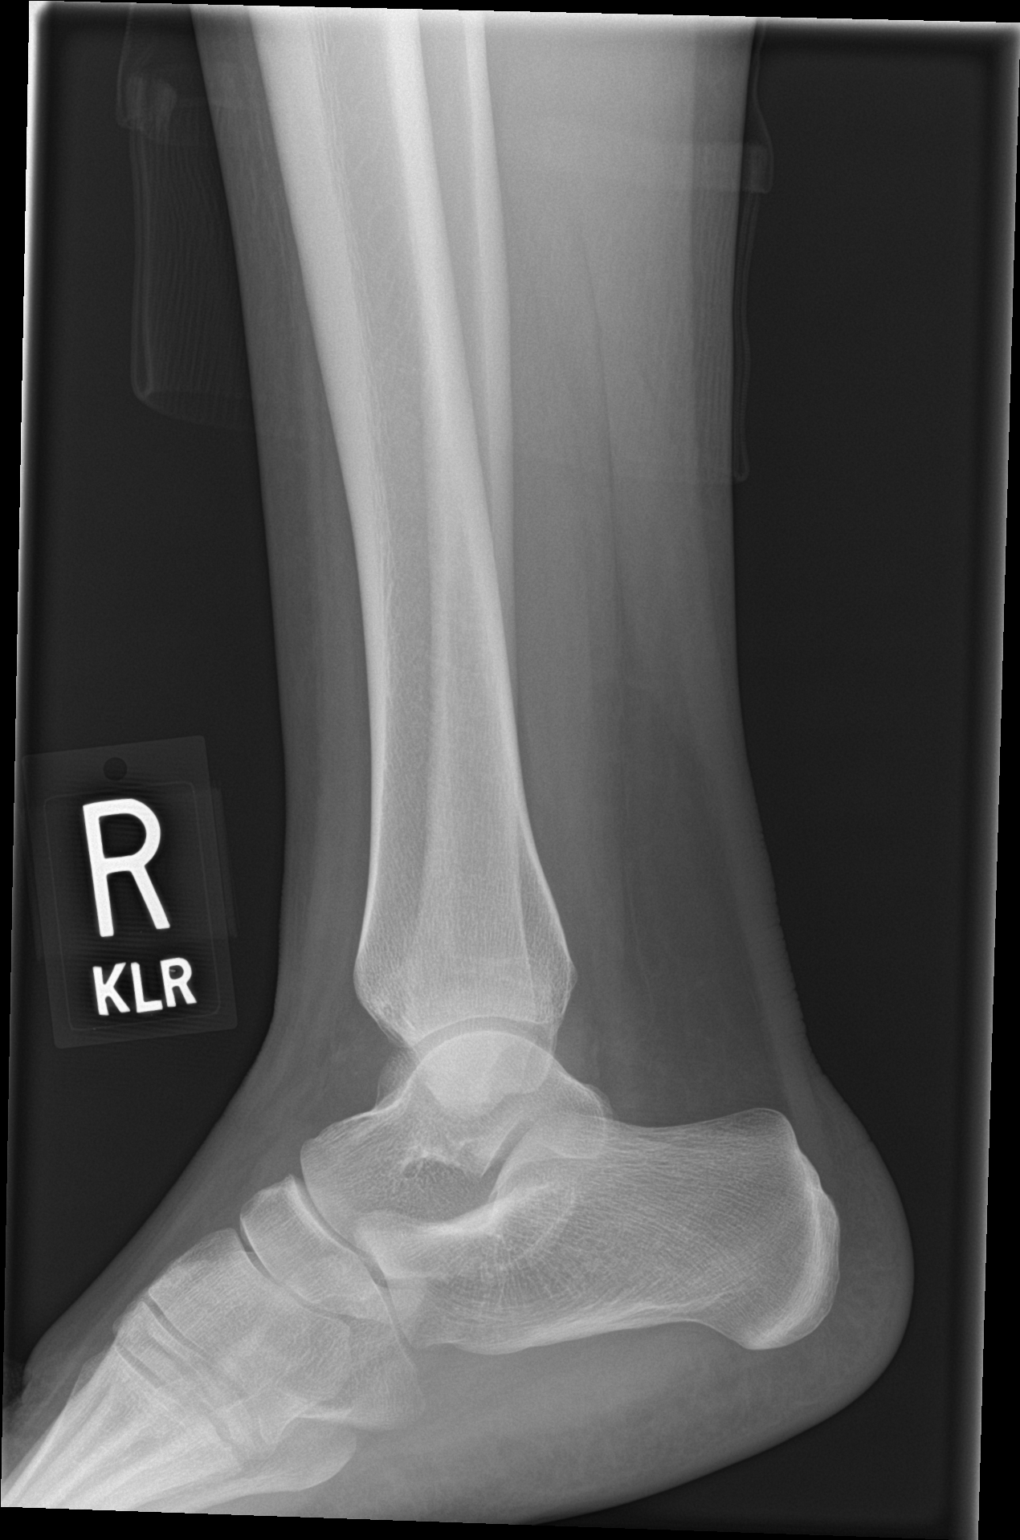

[3 of 3 positions shown; findings below may reference images not displayed]

FINDINGS: There is no evidence of fracture, dislocation, or joint effusion.
There is no evidence of arthropathy or other focal bone abnormality.
Soft tissues are unremarkable.
IMPRESSION: Negative.

## 2021-06-16 ENCOUNTER — Ambulatory Visit: Payer: Medicaid Other

## 2022-01-22 ENCOUNTER — Encounter: Payer: Self-pay | Admitting: Emergency Medicine

## 2022-01-22 ENCOUNTER — Emergency Department: Payer: Medicaid Other

## 2022-01-22 ENCOUNTER — Other Ambulatory Visit: Payer: Self-pay

## 2022-01-22 DIAGNOSIS — Z20822 Contact with and (suspected) exposure to covid-19: Secondary | ICD-10-CM | POA: Diagnosis not present

## 2022-01-22 DIAGNOSIS — E0590 Thyrotoxicosis, unspecified without thyrotoxic crisis or storm: Secondary | ICD-10-CM | POA: Diagnosis not present

## 2022-01-22 DIAGNOSIS — R0789 Other chest pain: Secondary | ICD-10-CM | POA: Diagnosis not present

## 2022-01-22 DIAGNOSIS — R000 Tachycardia, unspecified: Secondary | ICD-10-CM | POA: Diagnosis not present

## 2022-01-22 DIAGNOSIS — D72829 Elevated white blood cell count, unspecified: Secondary | ICD-10-CM | POA: Diagnosis not present

## 2022-01-22 LAB — BASIC METABOLIC PANEL
Anion gap: 11 (ref 5–15)
BUN: 11 mg/dL (ref 6–20)
CO2: 19 mmol/L — ABNORMAL LOW (ref 22–32)
Calcium: 9.5 mg/dL (ref 8.9–10.3)
Chloride: 105 mmol/L (ref 98–111)
Creatinine, Ser: 0.36 mg/dL — ABNORMAL LOW (ref 0.44–1.00)
GFR, Estimated: >60 mL/min (ref >60)
Glucose, Bld: 93 mg/dL (ref 70–99)
Potassium: 3.6 mmol/L (ref 3.5–5.1)
Sodium: 135 mmol/L (ref 135–145)

## 2022-01-22 LAB — CBC
HCT: 35.3 % — ABNORMAL LOW (ref 36.0–46.0)
Hemoglobin: 11.4 g/dL — ABNORMAL LOW (ref 12.0–15.0)
MCH: 23.2 pg — ABNORMAL LOW (ref 26.0–34.0)
MCHC: 32.3 g/dL (ref 30.0–36.0)
MCV: 71.9 fL — ABNORMAL LOW (ref 80.0–100.0)
Platelets: 158 10*3/uL (ref 150–400)
RBC: 4.91 MIL/uL (ref 3.87–5.11)
RDW: 13.1 % (ref 11.5–15.5)
WBC: 14.8 10*3/uL — ABNORMAL HIGH (ref 4.0–10.5)
nRBC: 0 % (ref 0.0–0.2)

## 2022-01-22 LAB — TROPONIN I (HIGH SENSITIVITY): Troponin I (High Sensitivity): 5 ng/L (ref <18)

## 2022-01-22 NOTE — ED Notes (Addendum)
First Nurse Note: Pt arrives via EMS from home with complaints of CP (mid-sternal) all day - rating it 6/10. No meds given PTA.  12-lead Unremarkable. VSS with EMS

## 2022-01-22 NOTE — ED Triage Notes (Signed)
Pt c/o intermittent pressure-like centralized chest pain that started around 4 am. Pain associated dizziness and weakness. No cardiac hx. Hx of similar s/s related to hyperthyroid. Pt currently, noncompliant with medication, requesting refill in triage, unable to state when last dose.

## 2022-01-23 ENCOUNTER — Emergency Department
Admission: EM | Admit: 2022-01-23 | Discharge: 2022-01-23 | Disposition: A | Discharge status: Home / Self Care | Payer: Medicaid Other | Attending: Emergency Medicine | Admitting: Emergency Medicine

## 2022-01-23 DIAGNOSIS — R0789 Other chest pain: Secondary | ICD-10-CM

## 2022-01-23 DIAGNOSIS — E0590 Thyrotoxicosis, unspecified without thyrotoxic crisis or storm: Secondary | ICD-10-CM

## 2022-01-23 LAB — RESP PANEL BY RT-PCR (RSV, FLU A&B, COVID)  RVPGX2
Influenza A by PCR: NEGATIVE
Influenza B by PCR: NEGATIVE
Resp Syncytial Virus by PCR: NEGATIVE
SARS Coronavirus 2 by RT PCR: NEGATIVE

## 2022-01-23 LAB — T4, FREE: Free T4: >5.5 ng/dL — ABNORMAL HIGH (ref 0.61–1.12)

## 2022-01-23 LAB — TSH: TSH: <0.01 u[IU]/mL — ABNORMAL LOW (ref 0.350–4.500)

## 2022-01-23 MED ORDER — PROPRANOLOL HCL 20 MG PO TABS
20.0000 mg | ORAL_TABLET | Freq: Once | ORAL | Status: AC
  Administered 2022-01-23: 20 mg via ORAL
  Filled 2022-01-23: qty 1

## 2022-01-23 MED ORDER — LACTATED RINGERS IV BOLUS
1000.0000 mL | Freq: Once | INTRAVENOUS | Status: AC
  Administered 2022-01-23: 1000 mL via INTRAVENOUS

## 2022-01-23 MED ORDER — PROPRANOLOL HCL ER 60 MG PO CP24: 60.0000 mg | ORAL_CAPSULE | Freq: Every day | ORAL | 1 refills | Status: AC

## 2022-01-23 MED ORDER — PROPRANOLOL HCL 1 MG/ML IV SOLN
1.0000 mg | Freq: Once | INTRAVENOUS | Status: AC
  Administered 2022-01-23: 1 mg via INTRAVENOUS
  Filled 2022-01-23: qty 1

## 2022-01-23 MED ORDER — METHIMAZOLE 10 MG PO TABS: 10.0000 mg | ORAL_TABLET | Freq: Three times a day (TID) | ORAL | 1 refills | Status: AC

## 2022-01-23 MED ORDER — METHIMAZOLE 10 MG PO TABS
10.0000 mg | ORAL_TABLET | Freq: Once | ORAL | Status: AC
  Administered 2022-01-23: 10 mg via ORAL
  Filled 2022-01-23: qty 1

## 2022-01-23 NOTE — ED Provider Notes (Signed)
[facility] Provider Note    Event Date/Time   First MD Initiated Contact with Patient 01/23/22 [id number]     (approximate)   History   Chest Pain   HPI  Maureen Lopez is a 26 y.o. female who presents to the ED for evaluation of Chest Pain   I reviewed the [facility] ED visit from 1 month ago where she was seen for tachycardia associated with hyperthyroidism and noncompliance with her medications of propranolol and methimazole.  Patient presents to the ED for evaluation of about 2 days of heart racing, chest discomfort and feeling like that she is "having a thyroid attack."  She has not taken her medications for the past 1 month since she was seen in the [facility] ED.  Reports psychosocial stressors and financial difficulties with affording her medications.  She takes no OCPs or exogenous estrogen, no history of VTE.   Physical Exam   Triage Vital Signs: ED Triage Vitals  Enc Vitals Group     BP 01/22/22 2152 (!) 155/88     Pulse Rate 01/22/22 2152 (!) 119     Resp 01/22/22 2152 (!) 23     Temp 01/22/22 2152 98.6 F (37 C)     Temp Source 01/22/22 2152 Oral     SpO2 01/22/22 2152 99 %     Weight 01/22/22 2148 140 lb (63.5 kg)     Height 01/22/22 2148 5\' 3"  (1.6 m)     Head Circumference --      Peak Flow --      Pain Score 01/22/22 2148 6     Pain Loc --      Pain Edu? --      Excl. in GC? --     Most recent vital signs: Vitals:   01/23/22 0600 01/23/22 0630  BP: (!) 152/93 (!) 159/92  Pulse: (!) 110 (!) 117  Resp: (!) 31 (!) 29  Temp:    SpO2: 100% 100%    General: Awake, no distress.  Mild exophthalmos is present. CV:  Good peripheral perfusion.  Tachycardic and regular Resp:  Normal effort.  Clear lungs Abd:  No distention.  Soft and benign MSK:  No deformity noted.  Neuro:  No focal deficits appreciated. Other:     ED Results / Procedures / Treatments   Labs (all labs ordered are listed, but only abnormal results are  displayed) Labs Reviewed  BASIC METABOLIC PANEL - Abnormal; Notable for the following components:      Result Value   CO2 19 (*)    Creatinine, Ser 0.36 (*)    All other components within normal limits  CBC - Abnormal; Notable for the following components:   WBC 14.8 (*)    Hemoglobin 11.4 (*)    HCT 35.3 (*)    MCV 71.9 (*)    MCH 23.2 (*)    All other components within normal limits  T4, FREE - Abnormal; Notable for the following components:   Free T4 >5.50 (*)    All other components within normal limits  TSH - Abnormal; Notable for the following components:   TSH <0.010 (*)    All other components within normal limits  RESP PANEL BY RT-PCR (RSV, FLU A&B, COVID)  [id number]  POC URINE PREG, ED  TROPONIN I (HIGH SENSITIVITY)  TROPONIN I (HIGH SENSITIVITY)    EKG Sinus tachycardia with rate of 119 bpm.  Normal axis and intervals.  Nonspecific ST changes inferiorly without STEMI.  No comparison.  RADIOLOGY CXR interpreted by me without evidence of acute cardiopulmonary pathology.  Official radiology report(s): DG Chest 2 View  Result Date: 01/22/2022 CLINICAL DATA:  CP EXAM: CHEST - 2 VIEW COMPARISON:  None Available. FINDINGS: Cardiac silhouette is unremarkable. No pneumothorax or pleural effusion. The lungs are clear. The visualized skeletal structures are unremarkable. IMPRESSION: No acute cardiopulmonary process. Electronically Signed   By: Layla Maw M.D.   On: 01/22/2022 22:12    PROCEDURES and INTERVENTIONS:  .1-3 Lead EKG Interpretation  Performed by: Delton Prairie, MD Authorized by: Delton Prairie, MD     Interpretation: abnormal     ECG rate:  120   ECG rate assessment: tachycardic     Rhythm: sinus tachycardia     Ectopy: none     Conduction: normal   .Critical Care  Performed by: Delton Prairie, MD Authorized by: Delton Prairie, MD   Critical care provider statement:    Critical care time (minutes):  30   Critical care time was exclusive of:  Separately  billable procedures and treating other patients   Critical care was necessary to treat or prevent imminent or life-threatening deterioration of the following conditions:  Endocrine crisis   Critical care was time spent personally by me on the following activities:  Development of treatment plan with patient or surrogate, discussions with consultants, evaluation of patient's response to treatment, examination of patient, ordering and review of laboratory studies, ordering and review of radiographic studies, ordering and performing treatments and interventions, pulse oximetry, re-evaluation of patient's condition and review of old charts   Medications  propranolol (INDERAL) injection 1 mg (has no administration in time range)  lactated ringers bolus 1,000 mL (has no administration in time range)  propranolol (INDERAL) tablet 20 mg (20 mg Oral Given 01/23/22 0349)  methimazole (TAPAZOLE) tablet 10 mg (10 mg Oral Given 01/23/22 0348)     IMPRESSION / MDM / ASSESSMENT AND PLAN / ED COURSE  I reviewed the triage vital signs and the nursing notes.  Differential diagnosis includes, but is not limited to, thyroid storm, noncompliance, PE, pneumothorax, pneumonia, viral syndrome  {Patient presents with symptoms of an acute illness or injury that is potentially life-threatening.  26 year old woman presents to the ED with chest discomfort, likely due to noncompliance with her hyperthyroid medications.  She does have signs of acute hyperthyroidism with hypertension, tachycardia and symptoms consistent with previous episodes.  Sinus tach on the EKG without ischemic changes or comparisons available.  Metabolic panel with a mild nonanion gap metabolic acidosis.  First troponin is negative.  Mild leukocytosis is noted and nonspecific.  Her CXR is clear.  I think VTE is much less likely than hyperthyroidism.  We will treat for hypothyroidism, keep her on the monitor, send for second troponin and reassess.  Her  heart rates are improving with the oral methimazole and propranolol.  Awaiting second troponin, IV fluids and reassessment.  Doubt PE.  She will be signed out to oncoming provider for clinical reassessment and discussion of plan of care.  Anticipate she will be suitable for outpatient management as I doubt thyroid storm.  She is not cephalopathic and looks systemically well overall despite her vital sign derangements and serum endocrine derangements.      FINAL CLINICAL IMPRESSION(S) / ED DIAGNOSES   Final diagnoses:  Hyperthyroidism  Other chest pain     Rx / DC Orders   ED Discharge Orders     None  Note:  This document was prepared using Dragon voice recognition software and may include unintentional dictation errors.   Delton Prairie, MD 01/23/22 [phone]

## 2022-01-23 NOTE — ED Provider Notes (Signed)
Emergency department handoff note  Care of this patient was signed out to me at the end of the previous provider shift.  All pertinent patient information was conveyed and all questions were answered.  Patient pending treatment with propranolol and methimazole.  Patient's symptoms began to significantly improve in the emergency department after IV propranolol.  The patient has been reexamined and is ready to be discharged.  All diagnostic results have been reviewed and discussed with the patient/family.  Care plan has been outlined and the patient/family understands all current diagnoses, results, and treatment plans.  There are no new complaints, changes, or physical findings at this time.  All questions have been addressed and answered.  All medications, if any, that were given while in the emergency department or any that are being prescribed have been reviewed with the patient/family.  All side effects and adverse reactions have been explained.  Patient was instructed to, and agrees to follow-up with their primary care physician as well as return to the emergency department if any new or worsening symptoms develop.   Merwyn Katos, MD 01/23/22 [phone]
